# Patient Record
Sex: Female | Born: 1945 | Race: White | Hispanic: No | Marital: Single | Smoking: Never smoker
Health system: Southern US, Community
[De-identification: ages and names within clinical notes are randomized; demographics above are authoritative.]

## PROBLEM LIST (undated history)

## (undated) DIAGNOSIS — E785 Hyperlipidemia, unspecified: Secondary | ICD-10-CM

## (undated) DIAGNOSIS — I10 Essential (primary) hypertension: Secondary | ICD-10-CM

## (undated) DIAGNOSIS — K219 Gastro-esophageal reflux disease without esophagitis: Secondary | ICD-10-CM

## (undated) DIAGNOSIS — Z01419 Encounter for gynecological examination (general) (routine) without abnormal findings: Secondary | ICD-10-CM

## (undated) HISTORY — PX: FACELIFT: SHX1566

## (undated) HISTORY — DX: Gastro-esophageal reflux disease without esophagitis: K21.9

## (undated) HISTORY — PX: TONSILLECTOMY: SUR1361

## (undated) HISTORY — PX: ABDOMINAL HYSTERECTOMY: SHX81

## (undated) HISTORY — DX: Encounter for gynecological examination (general) (routine) without abnormal findings: Z01.419

## (undated) HISTORY — DX: Hyperlipidemia, unspecified: E78.5

## (undated) HISTORY — DX: Essential (primary) hypertension: I10

---

## 2004-09-12 ENCOUNTER — Ambulatory Visit: Payer: Self-pay | Admitting: Family Medicine

## 2005-01-06 ENCOUNTER — Ambulatory Visit: Payer: Self-pay | Admitting: Family Medicine

## 2005-01-07 ENCOUNTER — Encounter: Admission: RE | Admit: 2005-01-07 | Discharge: 2005-01-07 | Payer: Self-pay | Admitting: Family Medicine

## 2005-01-07 IMAGING — CR DG HIP (WITH OR WITHOUT PELVIS) 2-3V*L*
2 series · 2 of 2 positions shown · non-contrast
Comparison: none

CLINICAL DATA: Hip and buttock pain.  Low back pain.  
 TWO VIEW LEFT HIP:
 There is some degenerative change at the left hip with some subchondral sclerosis.  Joint space appears relatively well maintained.  No fracture or dislocation.

[view not recorded (1 of 2)]
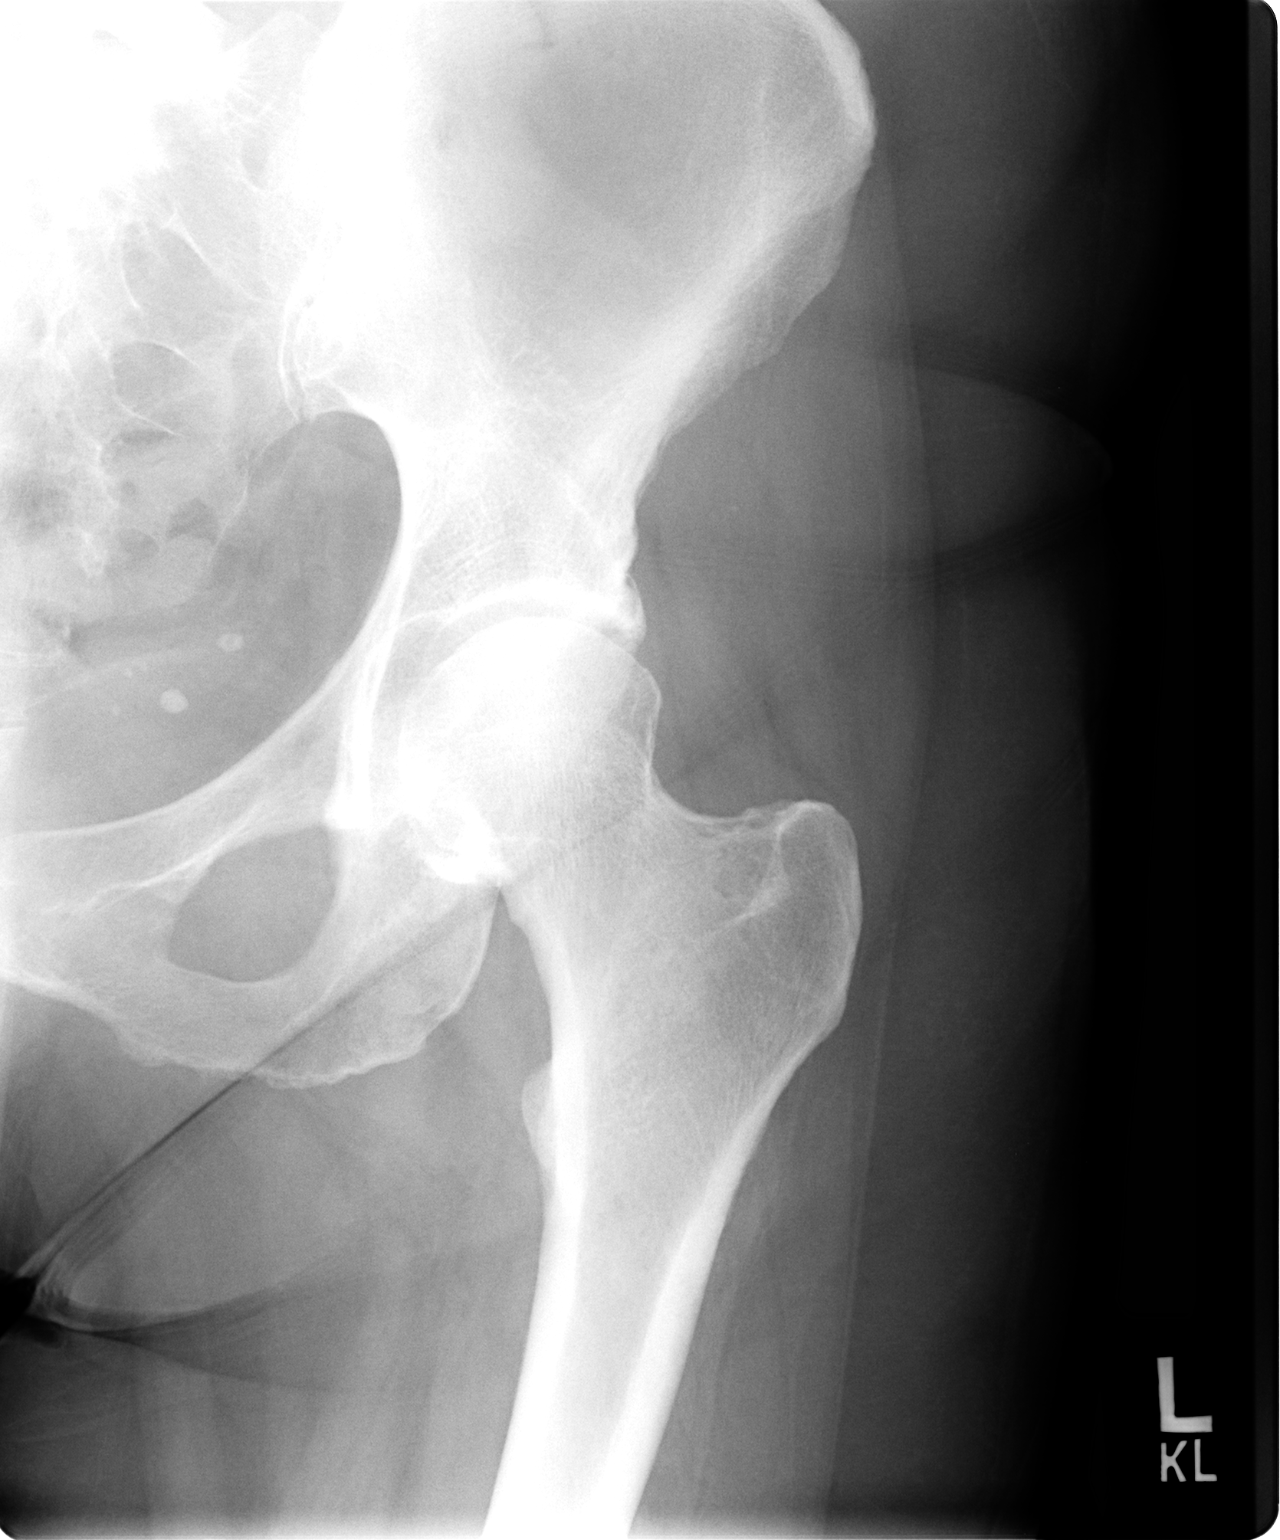

[view not recorded (2 of 2)]
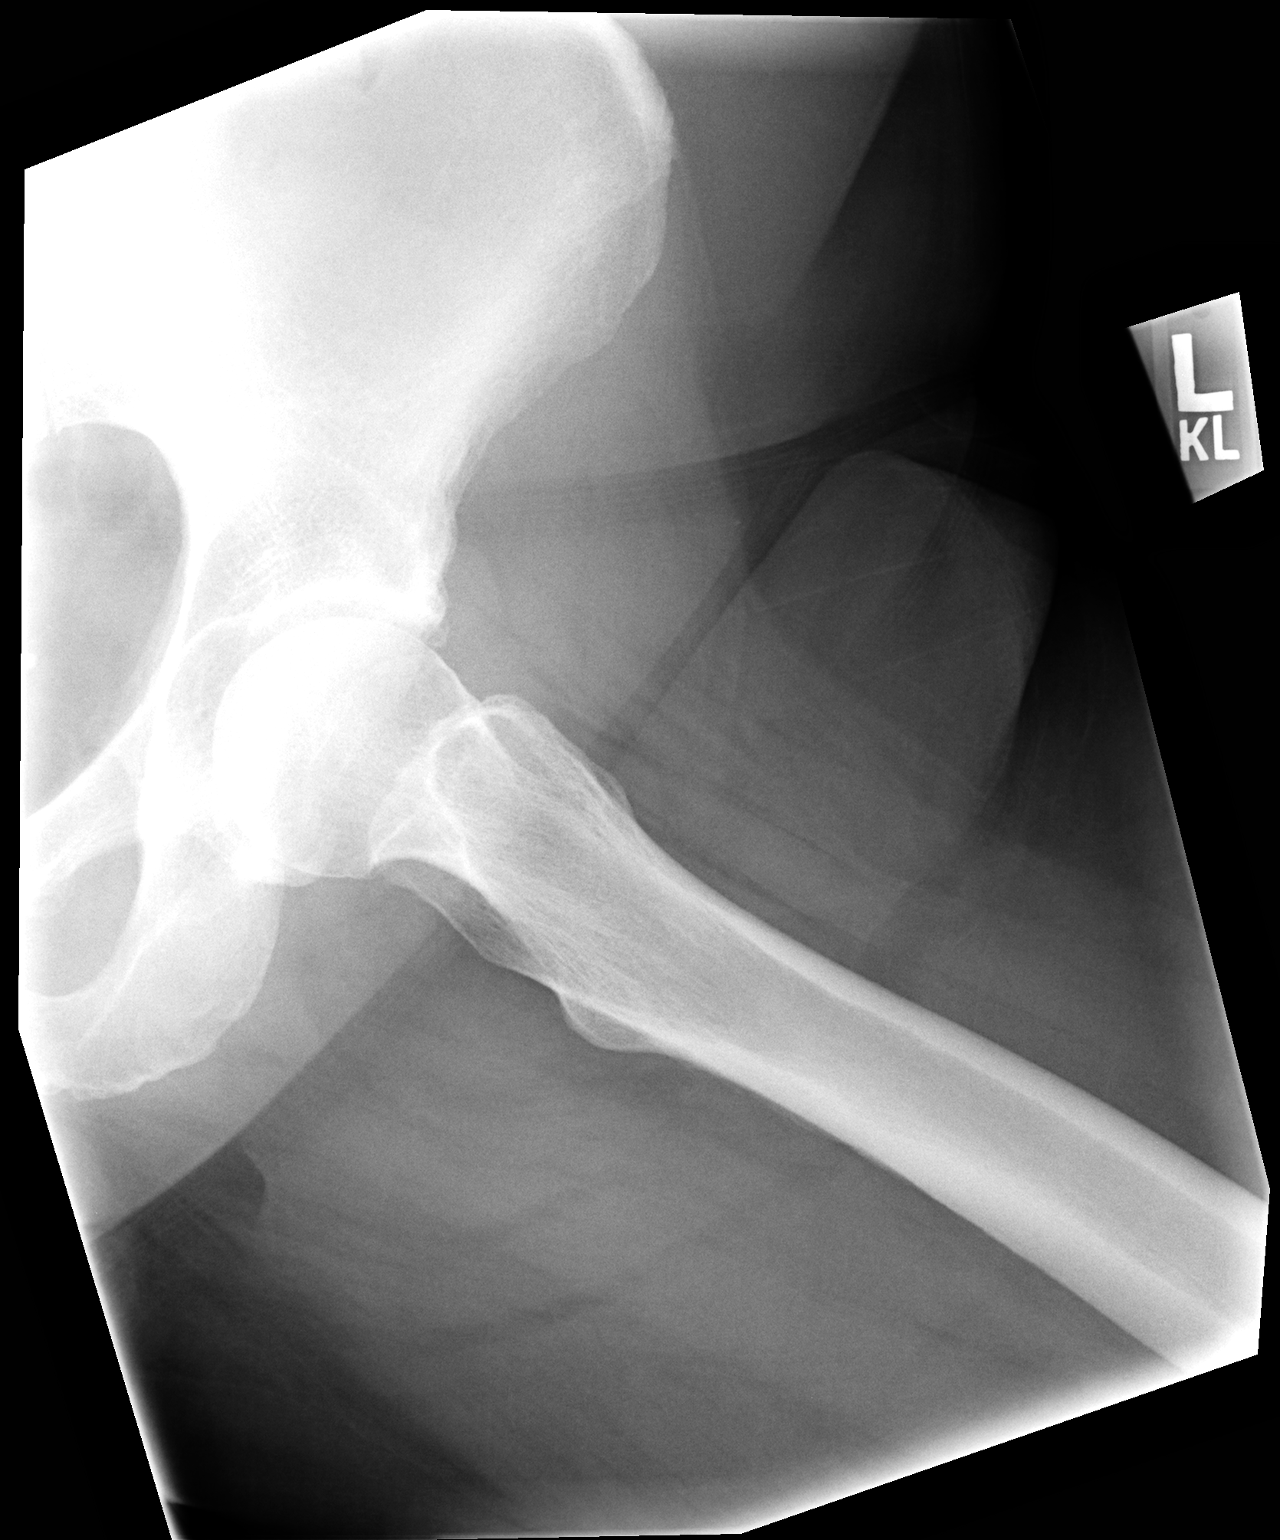

[2 of 2 positions shown; findings below may reference images not displayed]

IMPRESSION: Mild appearing left hip osteoarthritis. 
 FOUR VIEW LUMBAR SPINE:
 Vertebral body height and alignment are maintained.  There is loss of disc space height at L1-2 and L2-3 levels.  There is some anterior and posterior endplate spurring.  Facet arthropathy is noted.  Soft tissues are unremarkable.
IMPRESSION: Degenerative disease most noted at L1-2 and L2-3.  No other finding.

## 2005-02-27 ENCOUNTER — Encounter: Admission: RE | Admit: 2005-02-27 | Discharge: 2005-02-27 | Payer: Self-pay | Admitting: Family Medicine

## 2005-04-27 ENCOUNTER — Ambulatory Visit: Payer: Self-pay | Admitting: Family Medicine

## 2005-05-14 ENCOUNTER — Encounter: Admission: RE | Admit: 2005-05-14 | Discharge: 2005-05-14 | Payer: Self-pay | Admitting: Family Medicine

## 2005-05-18 ENCOUNTER — Ambulatory Visit: Payer: Self-pay | Admitting: Family Medicine

## 2005-09-15 ENCOUNTER — Ambulatory Visit: Payer: Self-pay | Admitting: Family Medicine

## 2005-12-07 ENCOUNTER — Ambulatory Visit: Payer: Self-pay | Admitting: Family Medicine

## 2006-03-17 ENCOUNTER — Encounter: Admission: RE | Admit: 2006-03-17 | Discharge: 2006-03-17 | Payer: Self-pay | Admitting: Family Medicine

## 2006-04-30 ENCOUNTER — Ambulatory Visit: Payer: Self-pay | Admitting: Family Medicine

## 2006-05-12 ENCOUNTER — Ambulatory Visit: Payer: Self-pay

## 2006-05-19 ENCOUNTER — Ambulatory Visit: Payer: Self-pay | Admitting: Family Medicine

## 2006-06-03 ENCOUNTER — Ambulatory Visit: Payer: Self-pay | Admitting: Family Medicine

## 2006-06-21 ENCOUNTER — Ambulatory Visit: Payer: Self-pay | Admitting: Cardiology

## 2006-07-19 ENCOUNTER — Ambulatory Visit: Payer: Self-pay | Admitting: Family Medicine

## 2006-07-20 LAB — CONVERTED CEMR LAB
Basophils Absolute: 0.1 10*3/uL (ref 0.0–0.1)
Eosinophils Absolute: 0.2 10*3/uL (ref 0.0–0.6)
Eosinophils Relative: 4.7 % (ref 0.0–5.0)
LH: 17.8 milliintl units/mL
MCHC: 33.8 g/dL (ref 30.0–36.0)
MCV: 95.1 fL (ref 78.0–100.0)
Platelets: 264 10*3/uL (ref 150–400)
RBC: 3.88 M/uL (ref 3.87–5.11)
RDW: 12.6 % (ref 11.5–14.6)
WBC: 5 10*3/uL (ref 4.5–10.5)

## 2006-07-30 ENCOUNTER — Ambulatory Visit: Payer: Self-pay | Admitting: Family Medicine

## 2006-07-30 LAB — CONVERTED CEMR LAB
AST: 17 units/L (ref 0–37)
Bilirubin, Direct: 0.1 mg/dL (ref 0.0–0.3)
Chloride: 106 meq/L (ref 96–112)
Cholesterol: 231 mg/dL (ref 0–200)
Creatinine, Ser: 0.7 mg/dL (ref 0.4–1.2)
Direct LDL: 124.7 mg/dL
Glucose, Bld: 98 mg/dL (ref 70–99)
HDL: 66.9 mg/dL (ref >39.0)
Sodium: 143 meq/L (ref 135–145)
Total Bilirubin: 1 mg/dL (ref 0.3–1.2)
Total Protein: 7.2 g/dL (ref 6.0–8.3)

## 2006-10-11 DIAGNOSIS — I10 Essential (primary) hypertension: Secondary | ICD-10-CM

## 2006-10-11 DIAGNOSIS — K219 Gastro-esophageal reflux disease without esophagitis: Secondary | ICD-10-CM

## 2006-11-17 ENCOUNTER — Ambulatory Visit: Payer: Self-pay | Admitting: Family Medicine

## 2006-11-26 ENCOUNTER — Encounter: Payer: Self-pay | Admitting: Family Medicine

## 2006-12-01 HISTORY — PX: COLONOSCOPY: SHX174

## 2006-12-23 ENCOUNTER — Ambulatory Visit: Payer: Self-pay | Admitting: Cardiology

## 2007-01-17 ENCOUNTER — Ambulatory Visit: Payer: Self-pay | Admitting: Family Medicine

## 2007-03-22 ENCOUNTER — Encounter: Admission: RE | Admit: 2007-03-22 | Discharge: 2007-03-22 | Payer: Self-pay | Admitting: Family Medicine

## 2007-03-23 ENCOUNTER — Ambulatory Visit: Payer: Self-pay | Admitting: Family Medicine

## 2007-05-12 ENCOUNTER — Encounter: Payer: Self-pay | Admitting: Family Medicine

## 2007-05-12 ENCOUNTER — Ambulatory Visit: Payer: Self-pay

## 2007-05-13 ENCOUNTER — Ambulatory Visit: Payer: Self-pay | Admitting: Family Medicine

## 2007-05-13 DIAGNOSIS — M722 Plantar fascial fibromatosis: Secondary | ICD-10-CM | POA: Insufficient documentation

## 2007-06-03 ENCOUNTER — Ambulatory Visit: Payer: Self-pay | Admitting: Cardiology

## 2007-06-20 ENCOUNTER — Ambulatory Visit: Payer: Self-pay | Admitting: Family Medicine

## 2007-06-20 DIAGNOSIS — D239 Other benign neoplasm of skin, unspecified: Secondary | ICD-10-CM | POA: Insufficient documentation

## 2007-06-21 ENCOUNTER — Ambulatory Visit: Payer: Self-pay | Admitting: Family Medicine

## 2007-07-06 ENCOUNTER — Ambulatory Visit: Payer: Self-pay | Admitting: Family Medicine

## 2007-11-03 ENCOUNTER — Telehealth: Payer: Self-pay | Admitting: Family Medicine

## 2008-01-05 ENCOUNTER — Encounter: Payer: Self-pay | Admitting: Family Medicine

## 2008-05-30 ENCOUNTER — Encounter: Payer: Self-pay | Admitting: Family Medicine

## 2008-12-17 ENCOUNTER — Encounter: Payer: Self-pay | Admitting: Family Medicine

## 2009-12-12 ENCOUNTER — Ambulatory Visit: Payer: Self-pay | Admitting: Family Medicine

## 2009-12-13 ENCOUNTER — Telehealth: Payer: Self-pay | Admitting: Family Medicine

## 2010-01-03 ENCOUNTER — Telehealth: Payer: Self-pay | Admitting: Family Medicine

## 2010-01-27 ENCOUNTER — Encounter: Payer: Self-pay | Admitting: Family Medicine

## 2010-01-29 ENCOUNTER — Telehealth: Payer: Self-pay | Admitting: Family Medicine

## 2010-01-30 LAB — CONVERTED CEMR LAB
Albumin: 4.3 g/dL (ref 3.5–5.2)
Alkaline Phosphatase: 95 units/L (ref 39–117)
Basophils Absolute: 0 10*3/uL (ref 0.0–0.1)
Basophils Relative: 0 % (ref 0–1)
Bilirubin Urine: NEGATIVE
Chloride: 101 meq/L (ref 96–112)
Creatinine, Ser: 0.74 mg/dL (ref 0.40–1.20)
HDL: 80 mg/dL (ref >39)
Ketones, ur: NEGATIVE mg/dL
LDL Cholesterol: 115 mg/dL — ABNORMAL HIGH (ref 0–99)
MCHC: 32 g/dL (ref 30.0–36.0)
Monocytes Absolute: 0.3 10*3/uL (ref 0.1–1.0)
Neutro Abs: 2.7 10*3/uL (ref 1.7–7.7)
Neutrophils Relative %: 54 % (ref 43–77)
Platelets: 263 10*3/uL (ref 150–400)
Potassium: 4.2 meq/L (ref 3.5–5.3)
Protein, ur: NEGATIVE mg/dL
RDW: 13.5 % (ref 11.5–15.5)
Sodium: 138 meq/L (ref 135–145)
Total Protein: 7.2 g/dL (ref 6.0–8.3)
Triglycerides: 115 mg/dL (ref <150)
Urobilinogen, UA: 0.2 (ref 0.0–1.0)

## 2010-02-05 ENCOUNTER — Telehealth: Payer: Self-pay | Admitting: Family Medicine

## 2010-02-18 ENCOUNTER — Encounter: Payer: Self-pay | Admitting: Family Medicine

## 2010-02-18 ENCOUNTER — Ambulatory Visit: Payer: Self-pay | Admitting: Family Medicine

## 2010-02-18 DIAGNOSIS — E785 Hyperlipidemia, unspecified: Secondary | ICD-10-CM

## 2010-03-18 ENCOUNTER — Other Ambulatory Visit: Payer: Self-pay | Admitting: Family Medicine

## 2010-03-18 ENCOUNTER — Ambulatory Visit
Admission: RE | Admit: 2010-03-18 | Discharge: 2010-03-18 | Payer: Self-pay | Source: Home / Self Care | Attending: Family Medicine | Admitting: Family Medicine

## 2010-03-18 DIAGNOSIS — L989 Disorder of the skin and subcutaneous tissue, unspecified: Secondary | ICD-10-CM | POA: Insufficient documentation

## 2010-03-26 ENCOUNTER — Encounter
Admission: RE | Admit: 2010-03-26 | Discharge: 2010-03-26 | Payer: Self-pay | Source: Home / Self Care | Attending: Family Medicine | Admitting: Family Medicine

## 2010-04-01 ENCOUNTER — Ambulatory Visit
Admission: RE | Admit: 2010-04-01 | Discharge: 2010-04-01 | Payer: Self-pay | Source: Home / Self Care | Attending: Family Medicine | Admitting: Family Medicine

## 2010-04-01 NOTE — Assessment & Plan Note (Signed)
Summary: flu shot/cjr  Nurse Visit   Allergies: No Known Drug Allergies  Orders Added: 1)  Admin 1st Vaccine [90471] 2)  Flu Vaccine 28yrs + [86578]  Flu Vaccine Consent Questions     Do you have a history of severe allergic reactions to this vaccine? no    Any prior history of allergic reactions to egg and/or gelatin? no    Do you have a sensitivity to the preservative Thimersol? no    Do you have a past history of Guillan-Barre Syndrome? no    Do you currently have an acute febrile illness? no    Have you ever had a severe reaction to latex? no    Vaccine information given and explained to patient? yes    Are you currently pregnant? no    Lot Number:[id number]   Exp Date:08/30/2010   Site Given  Left Deltoid IM Romualdo Bolk, CMA Duncan Dull)  December 12, 2009 2:17 PM

## 2010-04-01 NOTE — Progress Notes (Signed)
Summary: new rx needed  Phone Note Call from Patient Call back at Home Phone [phone]   Caller: Patient---live call Summary of Call: Pt's former Dr Has changed her Lisinopril 10mg  1qd, not 20mg . She is requesting a new rx sent [address] at [phone]. has cpx soon. Initial call taken by: Warnell Forester,  January 29, 2010 2:14 PM  Follow-up for Phone Call        change the Lisinopril to 10 mg once daily , Call in #30 with 2 rf  Follow-up by: Nelwyn Salisbury MD,  January 29, 2010 5:10 PM  Additional Follow-up for Phone Call Additional follow up Details #1::        done pt aware.  Additional Follow-up by: Pura Spice, RN,  January 30, 2010 8:24 AM    New/Updated Medications: LISINOPRIL 10 MG TABS (LISINOPRIL) 1 by mouth once daily Prescriptions: LISINOPRIL 10 MG TABS (LISINOPRIL) 1 by mouth once daily  #30 x 1   Entered by:   Pura Spice, RN   Authorized by:   Nelwyn Salisbury MD   Signed by:   Pura Spice, RN on 01/30/2010   Method used:   Electronically to        [facility] [address] [id number]* (retail)       [address] [place], [place]  [address]       Ph: [phone]       Fax: [phone]   RxID:   [id number]

## 2010-04-01 NOTE — Progress Notes (Signed)
Summary: REQUEST FOR ORDER (?)  Phone Note Call from Patient   Caller: Patient  [phone] Summary of Call: Pt called in to adv that she will need to obtain an order for cpx labwork to be done at [facility] Pt adv that her insurance [facility]) has advised her that they will not cover labwork if it is done in the doctors office / only covers if done at hospital?     Pt can be reached at [phone] when order is ready for p/u.  Initial call taken by: Debbra Riding,  January 03, 2010 12:05 PM  Follow-up for Phone Call        this makes no sense. Please call her and get more info.  Follow-up by: Nelwyn Salisbury MD,  January 03, 2010 2:55 PM  Additional Follow-up for Phone Call Additional follow up Details #1::        I called pts # [phone]....Marland KitchenMarland KitchenPt adv that [facility] is not licensed with her insurance [facility] Medicare PPO enhanced - [employer]) for labwork.... Therefore, she will have to go by the Hospital to have labwork done.... Pt adv that she even asked her insurance rep about having labwork at [facility] and they advised her that [employer] in general were not licensed with them???????????  Additional Follow-up by: Debbra Riding,  January 06, 2010 8:53 AM    Additional Follow-up for Phone Call Additional follow up Details #2::    what about drawing the labs here but sending them to [facility] to run?  Follow-up by: Nelwyn Salisbury MD,  January 06, 2010 1:26 PM  Additional Follow-up for Phone Call Additional follow up Details #3:: Details for Additional Follow-up Action Taken: I spoke with pt and she said that she spoke with a supervisor at her ins company ([employer] [place] [employer] [employer]) and they adv that her insurance will only pay for labwork to be done at 4 places in [place].......Marland Kitchen 1) [facility], 2) [facility], 3) [facility],  and  4) [facility]  [place]. Orders were written and can be picked up to take to [facility].  pt notified.    Additional Follow-up by: Debbra Riding,  January 06, 2010 3:37 PM

## 2010-04-01 NOTE — Progress Notes (Signed)
Summary: Pt has question re: getting H1N1 vaccine  Phone Note Call from Patient Call back at Home Phone [phone]   Caller: Patient Summary of Call: Pt wants to now how often do you have to get the H1N1 vaccine? Pls advise.  Initial call taken by: Lucy Antigua,  December 13, 2009 2:57 PM  Follow-up for Phone Call        once a year Follow-up by: Nelwyn Salisbury MD,  December 13, 2009 4:14 PM  Additional Follow-up for Phone Call Additional follow up Details #1::        called  Additional Follow-up by: Pura Spice, RN,  December 13, 2009 5:07 PM

## 2010-04-03 NOTE — Miscellaneous (Signed)
Summary: Consent for Mole Removal  Consent for Mole Removal   Imported By: Maryln Gottron 03/21/2010 09:45:57  _____________________________________________________________________  External Attachment:    Type:   Image     Comment:   External Document

## 2010-04-03 NOTE — Assessment & Plan Note (Signed)
Summary: CPX/CJR   Vital Signs:  Patient profile:   65 year old female Weight:      163 pounds BMI:     26.81 O2 Sat:      98 % Temp:     97.6 degrees F Pulse rate:   87 / minute BP sitting:   120 / 80  (left arm)  Vitals Entered By: Pura Spice, RN (February 18, 2010 1:44 PM) CC: cpx  no pap    History of Present Illness: 65 yr old female for a cpx. She is doing well and has no complaints. She and her partner moved back here from [place] earlier this year.   Allergies (verified): No Known Drug Allergies  Past History:  Past Medical History: Hypertension GERD Hyperlipidemia sees Dr. Tonny Bollman in [place] once a year for GYN exams  Past Surgical History: Hysterectomy Tonsillectomy facelift colonoscopy Oct. 2008, diverticulae only, repeat 10 yrs  Past History:  Care Management: Gynecology:  Dr Noel Gerold  Family History: Reviewed history from 11/17/2006 and no changes required. Family History of Arthritis Family History of Cardiovascular disorder  Social History: Reviewed history from 11/17/2006 and no changes required. Occupation: medical receptionist Single Never Smoked Alcohol use-yes  Review of Systems  The patient denies anorexia, fever, weight loss, weight gain, vision loss, decreased hearing, hoarseness, chest pain, syncope, dyspnea on exertion, peripheral edema, prolonged cough, headaches, hemoptysis, abdominal pain, melena, hematochezia, severe indigestion/heartburn, hematuria, incontinence, genital sores, muscle weakness, suspicious skin lesions, transient blindness, difficulty walking, depression, unusual weight change, abnormal bleeding, enlarged lymph nodes, angioedema, breast masses, and testicular masses.    Physical Exam  General:  Well-developed,well-nourished,in no acute distress; alert,appropriate and cooperative throughout examination Head:  Normocephalic and atraumatic without obvious abnormalities. No apparent alopecia or  balding. Eyes:  No corneal or conjunctival inflammation noted. EOMI. Perrla. Funduscopic exam benign, without hemorrhages, exudates or papilledema. Vision grossly normal. Ears:  External ear exam shows no significant lesions or deformities.  Otoscopic examination reveals clear canals, tympanic membranes are intact bilaterally without bulging, retraction, inflammation or discharge. Hearing is grossly normal bilaterally. Nose:  External nasal examination shows no deformity or inflammation. Nasal mucosa are pink and moist without lesions or exudates. Mouth:  Oral mucosa and oropharynx without lesions or exudates.  Teeth in good repair. Neck:  No deformities, masses, or tenderness noted. Chest Wall:  No deformities, masses, or tenderness noted. Lungs:  Normal respiratory effort, chest expands symmetrically. Lungs are clear to auscultation, no crackles or wheezes. Heart:  Normal rate and regular rhythm. S1 and S2 normal without gallop, murmur, click, rub or other extra sounds. EKG normal Abdomen:  Bowel sounds positive,abdomen soft and non-tender without masses, organomegaly or hernias noted. Msk:  No deformity or scoliosis noted of thoracic or lumbar spine.   Pulses:  R and L carotid,radial,femoral,dorsalis pedis and posterior tibial pulses are full and equal bilaterally Extremities:  No clubbing, cyanosis, edema, or deformity noted with normal full range of motion of all joints.   Neurologic:  No cranial nerve deficits noted. Station and gait are normal. Plantar reflexes are down-going bilaterally. DTRs are symmetrical throughout. Sensory, motor and coordinative functions appear intact. Skin:  Intact without suspicious lesions or rashes Cervical Nodes:  No lymphadenopathy noted Axillary Nodes:  No palpable lymphadenopathy Inguinal Nodes:  No significant adenopathy Psych:  Cognition and judgment appear intact. Alert and cooperative with normal attention span and concentration. No apparent delusions,  illusions, hallucinations   Impression & Recommendations:  Problem # 1:  HYPERLIPIDEMIA (ICD-272.4)  Problem # 2:  GERD (ICD-530.81)  Problem # 3:  HYPERTENSION (ICD-401.9)  Her updated medication list for this problem includes:    Lisinopril 10 Mg Tabs (Lisinopril) .Marland Kitchen... 1 by mouth once daily  Orders: EKG w/ Interpretation (93000)  Complete Medication List: 1)  Estradiol 2 Mg Tabs (Estradiol) .... Take 1 tablet by mouth once a day 2)  Aspirin 81 Mg Tbec (Aspirin) .... Once daily 3)  Calcium 600-200 Mg-unit Tabs (Calcium-vitamin d) .... Two times a day 4)  Lisinopril 10 Mg Tabs (Lisinopril) .Marland Kitchen.. 1 by mouth once daily 5)  Sm Milk of Magnesia 400 Mg/58ml Susp (Magnesium hydroxide) .Marland Kitchen.. 1 tablespoon at hs  Patient Instructions: 1)  Please schedule a follow-up appointment in 6 months .  Prescriptions: LISINOPRIL 10 MG TABS (LISINOPRIL) 1 by mouth once daily  #30 x 11   Entered and Authorized by:   Nelwyn Salisbury MD   Signed by:   Nelwyn Salisbury MD on 02/18/2010   Method used:   Electronically to        [facility] [address] [id number]* (retail)       [address]       [place], [place]  [address]       Ph: [phone]       Fax: [phone]   RxID:   [id number]    Orders Added: 1)  Est. Patient Level IV [33295] 2)  EKG w/ Interpretation [93000]

## 2010-04-03 NOTE — Progress Notes (Signed)
Summary: labs not covered.  Phone Note From Other Clinic   Caller: [facility] [phone] ext [phone] Summary of Call: christ[facility] from [facility] left vm regarding above pt stated she came in on 01-27-2010 and had drawn cbcd lipid and tsh and they need to use another code other than v70. pls call back  as medicare does not cover. Initial call taken by: Pura Spice, RN,  February 05, 2010 3:47 PM  Follow-up for Phone Call        I am not sure what dx code this should have been.  Dr. Clent Ridges please advised what the diagnosis should be for the labs that were ordered. Follow-up by: Trixie Dredge,  February 05, 2010 4:23 PM  Additional Follow-up for Phone Call Additional follow up Details #1::        change this to 401.9 please  Additional Follow-up by: Nelwyn Salisbury MD,  February 10, 2010 1:26 PM    Additional Follow-up for Phone Call Additional follow up Details #2::    [facility] for [facility] to correct dx code submitted. Follow-up by: Trixie Dredge,  February 12, 2010 2:56 PM

## 2010-04-03 NOTE — Assessment & Plan Note (Signed)
Summary: mole removal///ccm   Vital Signs:  Patient profile:   65 year old female Pulse rate:   112 / minute BP sitting:   150 / 86  (left arm) Cuff size:   regular  Vitals Entered By: Pura Spice, RN (March 18, 2010 1:55 PM) CC: mole removal   History of Present Illness: Here to remove 2 lesions on her back that we evaluated at our last visit.   Allergies: No Known Drug Allergies  Physical Exam  General:  Well-developed,well-nourished,in no acute distress; alert,appropriate and cooperative throughout examination Skin:  the upper lesion appears to be a wart,is  located on the left upper shoulder, and measures 1.3  cm across.  The lower lesion is in the center of the lower back, appears to be an achrocordon, and measures 0.6 cm across.   Impression & Recommendations:  Problem # 1:  SKIN LESION (ICD-709.9)  Orders: Excise lesion (TAL) 0.6 - 1.0 (11401) Excise lesion (TAL) 1.1 - 2.0 cm (11402)  Complete Medication List: 1)  Estradiol 2 Mg Tabs (Estradiol) .... Take 1 tablet by mouth once a day 2)  Aspirin 81 Mg Tbec (Aspirin) .... Once daily 3)  Calcium 600-200 Mg-unit Tabs (Calcium-vitamin d) .... Two times a day 4)  Lisinopril 10 Mg Tabs (Lisinopril) .Marland Kitchen.. 1 by mouth once daily 5)  Sm Milk of Magnesia 400 Mg/32ml Susp (Magnesium hydroxide) .Marland Kitchen.. 1 tablespoon at hs  Patient Instructions: 1)  after informed consent was obtained, both areas were cleansed with Betadine. LA was achieved at both sites using 1% Xylocaine with epinephrine. An elliptical incision was made around the upper lesion with a scalpel down to the subcutaneous fat layer. The lesion was removed and sent to Pathology. The wound was closed using 3 sutures of 4-0 Ethilon. Dressed with Neosporin and gauze. The lower lesion was shaved off using a scalpel, and it was sent to Pathology. The wound was cauterized with a hyfrecator, and dressed. Sutures are to be removed in 14 days.    Orders Added: 1)  Excise  lesion (TAL) 0.6 - 1.0 [11401] 2)  Excise lesion (TAL) 1.1 - 2.0 cm [11402]   Immunization History:  Pneumovax Immunization History:    Pneumovax:  historical (03/03/2007)   Immunization History:  Pneumovax Immunization History:    Pneumovax:  Historical (03/03/2007)

## 2010-04-09 NOTE — Assessment & Plan Note (Signed)
Summary: ROA/FUP/RCD   Vital Signs:  Patient profile:   65 year old female Weight:      164 pounds O2 Sat:      95 % Temp:     98.4 degrees F Pulse rate:   84 / minute BP sitting:   120 / 80  (left arm) Cuff size:   regular  Vitals Entered By: Pura Spice, RN (April 01, 2010 10:49 AM) CC: remove sutures left upper shoulder  area well approximated  no sign of infection   History of Present Illness: Here to remove sutures after we removed some benign lesions 2 weeks ago. The upper surgical site has 3 sutures present. The area itches a bit bit otherwise she is doing well.   Allergies: No Known Drug Allergies  Past History:  Past Medical History: Reviewed history from 02/18/2010 and no changes required. Hypertension GERD Hyperlipidemia sees Dr. Tonny Bollman in [place] once a year for GYN exams  Review of Systems  The patient denies anorexia, fever, weight loss, weight gain, vision loss, decreased hearing, hoarseness, chest pain, syncope, dyspnea on exertion, peripheral edema, prolonged cough, headaches, hemoptysis, abdominal pain, melena, hematochezia, severe indigestion/heartburn, hematuria, incontinence, genital sores, muscle weakness, suspicious skin lesions, transient blindness, difficulty walking, depression, unusual weight change, abnormal bleeding, enlarged lymph nodes, angioedema, breast masses, and testicular masses.    Physical Exam  General:  Well-developed,well-nourished,in no acute distress; alert,appropriate and cooperative throughout examination Skin:  both sites look good and are healing well.    Impression & Recommendations:  Problem # 1:  SKIN LESION (ICD-709.9)  Orders: No Charge Patient Arrived (NCPA0) (NCPA0)  Complete Medication List: 1)  Estradiol 2 Mg Tabs (Estradiol) .... Take 1 tablet by mouth once a day 2)  Aspirin 81 Mg Tbec (Aspirin) .... Once daily 3)  Calcium 600-200 Mg-unit Tabs (Calcium-vitamin d) .... Two times a day 4)  Lisinopril  10 Mg Tabs (Lisinopril) .Marland Kitchen.. 1 by mouth once daily 5)  Sm Milk of Magnesia 400 Mg/33ml Susp (Magnesium hydroxide) .Marland Kitchen.. 1 tablespoon at hs  Patient Instructions: 1)  all 3 sutures were removed. Follow up as scheduled   Orders Added: 1)  No Charge Patient Arrived (NCPA0) [NCPA0]

## 2010-04-14 ENCOUNTER — Telehealth: Payer: Self-pay | Admitting: Family Medicine

## 2010-04-14 NOTE — Telephone Encounter (Signed)
Pt called and is req to get a copy of mammogram.   Pls call when this is ready for pick up.

## 2010-04-14 NOTE — Telephone Encounter (Signed)
Pt notified and copy of report at front desk.

## 2010-07-15 NOTE — Assessment & Plan Note (Signed)
[facility]                            CARDIOLOGY OFFICE NOTE   NAME:HARRISDearia, Wilmouth                         MRN:          [id number]  DATE:12/23/2006                            DOB:          1945/10/25    Ms. Zurn is a very pleasant 65 year old female who has a history of  hypertension and an abnormal nuclear study.  Her nuclear study in March  of 2008 showed a question of mild apical ischemia.  We have elected to  follow this.  Since I last saw her she is doing extremely well.  There  is no dyspnea, chest pain, palpitations or syncope.  She has had no  syncopal episodes.   MEDICATIONS:  Include lisinopril 20 mg p.o. daily, Estradiol 2 mg p.o.  daily, aspirin 81 mg p.o. daily, calcium and D.   PHYSICAL EXAM TODAY:  Shows a blood pressure of 120/78 and her pulse is  65. She weighs 152 pounds.  HEENT:  Normal.  NECK:  Supple with no bruits.  CHEST:  Clear.  CARDIOVASCULAR EXAM:  Regular rate and rhythm.  ABDOMEN:  Exam shows no tenderness.  EXTREMITIES:  Show no edema.   Her electrocardiogram shows a sinus rhythm at a rate of 65.  The axis is  normal.  There are no ST changes noted.   DIAGNOSES:  1. Recent abnormal nuclear study.  I have discussed again the results      of her previous nuclear study.  There may be some shifting breast      attenuation that contributed to her small defect.  Regardless, it      was a low-risk study and we have elected to not pursue cardiac      catheterization given she is having no symptoms.  She would like to      continue with this course.  We will plan to repeat her Myoview in 6      months and then I will see her back.  If it is unchanged from      normal then we again will not pursue further cardiac workup.  2. Hypertension - her blood pressure is well controlled on her present      medications.   We will see her back in 6 months.     Madolyn Frieze Jens Som, MD, [facility]  Electronically Signed    BSC/MedQ   DD: 12/23/2006  DT: 12/23/2006  Job #: [id number]   cc:   Jeannett Senior A. Clent Ridges, MD

## 2010-07-15 NOTE — Assessment & Plan Note (Signed)
[facility]                            CARDIOLOGY OFFICE NOTE   NATALINA, WIETING                         MRN:          [id number]  DATE:06/03/2007                            DOB:          29-Oct-1945    Ms. Omura is a pleasant, 65 year old with a history of hypertension and  mild abnormality on a previous nuclear study.  Her nuclear study in  March 2008, showed a question of mild apical ischemia.  However, her  symptoms had improved and we have elected to follow this.  She had a  repeat Myoview on May 12, 2007.  At that time, her perfusion was  normal and ejection fraction was 80%.  Since I last saw her, she has not  had chest pain, shortness of breath, edema or syncope.   MEDICATIONS:  1. Lisinopril 20 mg daily.  2. Estradiol 2 mg daily.  3. Aspirin 81 mg daily.  4. Calcium as well as vitamin D.   PHYSICAL EXAMINATION:  VITAL SIGNS:  Blood pressure 119/72 and a pulse  of 69.  Weight 154 pounds.  HEENT:  Normal.  NECK:  Supple.  No bruits.  CHEST:  Clear.  CARDIOVASCULAR:  Regular rate and rhythm.  ABDOMEN:  No tenderness.  EXTREMITIES:  No edema.   Electrocardiogram shows sinus rhythm at rate of 75.  No ST changes  noted.   DIAGNOSES:  1. History of mildly abnormal nuclear study.  Her followup nuclear      study shows no abnormalities and she is not having chest pain.      Will not pursue further cardiac workup.  2. Hypertension.  Blood pressure controlled on present medications.   Ms. Tieken is planning to move to [place].  She will follow up with a  primary care physician there.  We will see her back on as-needed basis.     Madolyn Frieze Jens Som, MD, [facility]  Electronically Signed    BSC/MedQ  DD: 06/03/2007  DT: 06/03/2007  Job #: [id number]

## 2010-07-18 NOTE — Assessment & Plan Note (Signed)
[facility] OFFICE NOTE   NAME:[name], [name]                         MRN:          [id number]  DATE:09/15/2005                            DOB:          [birth year]    HISTORY OF PRESENT ILLNESS:  This is a 65 year old woman here for a non-  gynecological physical examination. On physical examination, she feels fine  and has no complaints. She continues to go up to [place] once a year to  have her gynecologist give her an examination. Everything has been coming  out fine. She continues to hormone replacement therapy. Her blood pressures  have been well managed at home. She continues to see her optometrist for  twice a year visits for mildly elevated eye pressures. Thus far, she has not  needed therapy for this. For details of her past medical history, family  history, social history, etc., See note dated September 12, 2004. She was here  for fasting laboratories on May 18, 2005. This was remarkable only for her  lipid panel. LDL was mildly elevated at 147 but her HDL was excellent was  68. She does try to watch what she eats.   ALLERGIES:  NO KNOWN DRUG ALLERGIES.   CURRENT MEDICATIONS:  Estradiol 20 mg daily, aspirin 325 mg 1 every other  day, calcium with vitamin D supplements daily, and Lisinopril 20 mg once  daily.   OBJECTIVE:  VITAL SIGNS:  Height 5 foot 6. Weight 152. Blood pressure  124/80. Pulse 70 and regular.  GENERAL:  She appears healthy.  SKIN:  Free of significant lesions.  HEENT:  Eyes clear. Sclera clear. Pharynx clear.  NECK:  Supple without lymphadenopathy, masses.  LUNGS:  Clear.  CARDIAC:  Regular rate and rhythm without murmur, rub, or gallop. Distal  pulses are full. EKG is within normal limits.  ABDOMEN:  Soft. Normal bowel sounds. Nontender and no masses.  RECTAL:  No masses or tenderness. Stool Hemoccult negative.  EXTREMITIES:  No clubbing, cyanosis, or edema.  NEUROLOGIC:   Examination is grossly intact.   ASSESSMENT:  1.  COMPLETE PHYSICAL EXAMINATION:  She has never had a colonoscopy and I      had yet another conversation with her about the importance of doing it.      She is still reluctant to do so, however, and said that she would think      about it.  2.  HORMONE REPLACEMENT THERAPY:  Per her gynecologist.  3.  MILD HYPERLIPIDEMIA:  She will continue to watch her diet.  4.  HYPERTENSION:  Stable.                                   Tera Mater. Clent Ridges, MD   SAF/MedQ  DD:  09/15/2005  DT:  09/15/2005  Job #:  [id number]

## 2010-07-18 NOTE — Assessment & Plan Note (Signed)
[facility]                            CARDIOLOGY OFFICE NOTE   ESMA, KILTS                         MRN:          [id number]  DATE:06/21/2006                            DOB:          1945/04/05    Ms. Chestang is a very pleasant 65 year old female with past medical  history of hypertension who we were asked to evaluate for an abnormal  nuclear study.  She has no prior cardiac history.  Note, she walks up to  an hour a day almost 4 times a week.  She typically does not have  dyspnea on exertion, orthopnea, PND, pedal edema, palpitations,  presyncope, syncope, or exertional chest pain.  Back in early March she  was at a dinner and developed a queasy feeling in her epigastric area  associated with mild palpitations, mild dizziness, and mild nausea.  She  did not have chest pain.  Because of her symptoms she had a Myoview  ordered which was performed on June 12, 2006.  She had an ejection  fraction of 85%.  There was mild apical ischemia noted.  Because of her  abnormal nuclear study we were asked to further evaluate.  SHE HAS NO  KNOWN DRUG ALLERGIES.   MEDICATIONS:  1. Lisinopril 20 mg p.o. daily.  2. Aspirin 81 mg p.o. daily.  3. Estradiol 2 mg p.o. daily.  4. Calcium and vitamin D.   SOCIAL HISTORY:  She smoked briefly when she was in college, but has  otherwise not smoked.  She consumes 1-2 alcoholic beverages average per  day.   FAMILY HISTORY:  Negative for coronary artery disease (her mother does  have a history of atrial fibrillation and her father did have a  myocardial infarction but he was in his late 3s).   PAST MEDICAL HISTORY:  There is no diabetes mellitus or hyperlipidemia,  but there is hypertension.  She has had a prior hysterectomy.  There is  no other medical conditions noted other than brief problems with reflux  in the past.   REVIEW OF SYSTEMS:  There is no headaches, or fevers, or chills.  There  is no productive  cough or hemoptysis.  There is no dysphagia,  odynophagia, melena, or hematochezia.  There is no dysuria or hematuria.  There is no rash or seizure activity.  There is no orthopnea, PND, or  pedal edema.  There is no claudication.  The remaining systems are  negative.   PHYSICAL EXAMINATION:  Shows a blood pressure of 138/86 and her pulse is  75.  She weighs 150 pounds.  She is well-developed and well-nourished,  in no acute distress.  SKIN:  Warm and dry.  She does not appear to be depressed.  There is no peripheral clubbing.  BACK:  Normal.  HEENT:  Normal with normal eyelids.  NECK:  Supple with a normal upstroke bilaterally and I could not  appreciate bruits.  There is no jugular venous distention and no  thyromegaly is noted.  CHEST:  Clear to auscultational expansion.  CARDIOVASCULAR:  Regular rate and rhythm, normal S1 and  S2.  There are  no murmurs, rubs, or gallops noted.  Her PMI is nondisplaced.  ABDOMINAL:  Not tender or distended, positive bowel sounds, no  hepatosplenomegaly, no mass appreciated.  There is no abdominal bruit.  She has 2+ femoral pulses bilaterally and no bruits.  EXTREMITIES:  Show no edema I can palpate, no cords.  She has 2+  dorsalis pedis pulses bilaterally.  NEUROLOGIC:  Grossly intact.   Her electrocardiogram shows a sinus rhythm at a rate of 77, there are no  ST changes noted.   DIAGNOSES:  1. Abnormal nuclear study - I have reviewed her images with her today      and they do reveal a very mild apical ischemia.  I have discussed      at length the implications of this.  I do not think that it is a      high risk scan.  I question whether there may be some breast      artifact that may be contributing.  We have discussed the options      of continued medical therapy with followup Myoview versus cardiac      catheterization.  She would prefer the former and I think this is      certainly acceptable.  If she has worsening symptoms in the  future      then we could rethink this and proceed with catheterization at that      point.  I will see her back in 6 months to review her symptoms and      if she remains asymptomatic then we will plan to repeat her Myoview      in one year.  2. Hypertension - her blood pressure is well controlled on her present      medications.  3. Risk factor modification - she will continue with diet and      exercise.  Note, she does not smoke.   We will see her back in 6 months.     Madolyn Frieze Jens Som, MD, [facility]  Electronically Signed    BSC/MedQ  DD: 06/21/2006  DT: 06/21/2006  Job #: [id number]   cc:   Jeannett Senior A. Clent Ridges, MD

## 2010-08-19 ENCOUNTER — Encounter: Payer: Self-pay | Admitting: Family Medicine

## 2010-08-19 ENCOUNTER — Ambulatory Visit (INDEPENDENT_AMBULATORY_CARE_PROVIDER_SITE_OTHER): Payer: BC Managed Care – PPO | Admitting: Family Medicine

## 2010-08-19 VITALS — BP 130/94 | HR 88 | Temp 98.5°F | Ht 66.0 in | Wt 167.0 lb

## 2010-08-19 DIAGNOSIS — I10 Essential (primary) hypertension: Secondary | ICD-10-CM

## 2010-08-19 DIAGNOSIS — R490 Dysphonia: Secondary | ICD-10-CM

## 2010-08-19 MED ORDER — METOPROLOL SUCCINATE ER 25 MG PO TB24: 25.0000 mg | ORAL_TABLET | Freq: Every day | ORAL | Status: DC

## 2010-08-19 NOTE — Progress Notes (Signed)
  Subjective:    Patient ID: [name], female    DOB: 1946-03-01, 65 y.o.   MRN: [id number]  HPI Here for recent elevated BPs and for hoarseness. Several years ago while living in [place], she experienced recurrent hoarseness which was not due to upper respiratory causes. 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 Now this has come back for 2 weeks, and she asks to see an ENT here. No URI symptoms. No GERD symptoms. Her BP ayt home has been in the 140s over 90s for several weeks.    Review of Systems  Constitutional: Negative.   HENT: Negative.   Respiratory: Negative.   Cardiovascular: Negative.        Objective:   Physical Exam  Constitutional: She appears well-developed and well-nourished.  HENT:  Mouth/Throat: Oropharynx is clear and moist. No oropharyngeal exudate.  Neck: No thyromegaly present.  Cardiovascular: Normal rate, regular rhythm, normal heart sounds and intact distal pulses.   Pulmonary/Chest: Effort normal and breath sounds normal. No respiratory distress. She has no wheezes. She has no rales. She exhibits no tenderness.       Voice is hoarse   Lymphadenopathy:    She has no cervical adenopathy.          Assessment & Plan:  Her hoarseness could be a side effect of her ACE inhibitor, so we will stop the Lisinopril and switch to Metoprolol. Recheck in 2 weeks. Refer to ENT

## 2010-09-02 ENCOUNTER — Encounter: Payer: Self-pay | Admitting: Family Medicine

## 2010-09-02 ENCOUNTER — Ambulatory Visit (INDEPENDENT_AMBULATORY_CARE_PROVIDER_SITE_OTHER): Payer: BC Managed Care – PPO | Admitting: Family Medicine

## 2010-09-02 VITALS — BP 126/88 | HR 71 | Temp 98.2°F | Wt 166.0 lb

## 2010-09-02 DIAGNOSIS — I10 Essential (primary) hypertension: Secondary | ICD-10-CM

## 2010-09-02 DIAGNOSIS — R490 Dysphonia: Secondary | ICD-10-CM

## 2010-09-02 MED ORDER — METOPROLOL SUCCINATE ER 25 MG PO TB24: 25.0000 mg | ORAL_TABLET | Freq: Two times a day (BID) | ORAL | Status: DC

## 2010-09-02 MED ORDER — METOPROLOL SUCCINATE ER 25 MG PO TB24: 25.0000 mg | ORAL_TABLET | Freq: Every day | ORAL | Status: DC

## 2010-09-02 NOTE — Progress Notes (Signed)
  Subjective:    Patient ID: [name], female    DOB: May 22, 1945, 65 y.o.   MRN: [id number]  HPI Here to follow up on BP and hoarseness. She has switched from Lisinopril to Metoprolol, and her BP is still a little high. She swings from 120 to 170 systolic during the day. She feels fine except for the hoarseness. She is set to see Dr. Jenne Pane on 09-17-10 for an ENT visit.    Review of Systems  Constitutional: Negative.   Respiratory: Negative.   Cardiovascular: Negative.        Objective:   Physical Exam  Constitutional: She appears well-developed and well-nourished.  Cardiovascular: Normal rate, regular rhythm, normal heart sounds and intact distal pulses.   Pulmonary/Chest: Effort normal and breath sounds normal.       Voice is hoarse          Assessment & Plan:  Increase Metoprolol to bid to have a more consistent coverage. See Dr. Jenne Pane for a probable laryngoscopy

## 2010-09-22 ENCOUNTER — Other Ambulatory Visit: Payer: Self-pay | Admitting: Otolaryngology

## 2010-09-22 DIAGNOSIS — J3800 Paralysis of vocal cords and larynx, unspecified: Secondary | ICD-10-CM

## 2010-09-25 ENCOUNTER — Other Ambulatory Visit: Payer: Self-pay | Admitting: Otolaryngology

## 2010-09-25 ENCOUNTER — Other Ambulatory Visit: Payer: Self-pay | Admitting: Anesthesiology

## 2010-09-25 ENCOUNTER — Ambulatory Visit
Admission: RE | Admit: 2010-09-25 | Discharge: 2010-09-25 | Disposition: A | Discharge status: Home / Self Care | Payer: Medicare Other | Source: Ambulatory Visit | Attending: Otolaryngology | Admitting: Otolaryngology

## 2010-09-25 DIAGNOSIS — J3800 Paralysis of vocal cords and larynx, unspecified: Secondary | ICD-10-CM

## 2010-09-25 MED ORDER — IOHEXOL 300 MG/ML  SOLN
75.0000 mL | Freq: Once | INTRAMUSCULAR | Status: AC | PRN
  Administered 2010-09-25: 75 mL via INTRAVENOUS

## 2010-10-10 ENCOUNTER — Telehealth: Payer: Self-pay | Admitting: Family Medicine

## 2010-10-10 MED ORDER — OMEPRAZOLE 40 MG PO CPDR: 40.0000 mg | DELAYED_RELEASE_CAPSULE | Freq: Every day | ORAL | Status: DC

## 2010-10-10 MED ORDER — OMEPRAZOLE 20 MG PO CPDR: 40.0000 mg | DELAYED_RELEASE_CAPSULE | Freq: Every day | ORAL | Status: DC

## 2010-10-10 NOTE — Telephone Encounter (Signed)
Pt wants any type of reflux medication that comes in a generic form.

## 2010-10-10 NOTE — Telephone Encounter (Signed)
Script sent e-scribe and pt aware. 

## 2010-10-10 NOTE — Telephone Encounter (Signed)
Left message for pt with below info. I can call in the Nexium at pt request.

## 2010-10-10 NOTE — Telephone Encounter (Signed)
Yes I saw the ENT note and the results of her CT scan. Her BP perfect so let's keep the Metoprolol where it is. There is no generic Nexium, but we can refill this.

## 2010-10-10 NOTE — Telephone Encounter (Signed)
Pt wants to make sure that Dr. Clent Ridges did see the results from the ENT, also requesting a generic script for Nexium, and does she need to continue with current dosage for Metoprolol? Her most recent readings are 126/82 in the AM's and 118/78 in the PM's. Please advise?

## 2010-10-10 NOTE — Telephone Encounter (Signed)
Change from Nexium to Omeprazole 40 mg a day. Call in one year supply

## 2010-10-28 ENCOUNTER — Encounter: Payer: Self-pay | Admitting: Family Medicine

## 2010-10-28 ENCOUNTER — Ambulatory Visit (INDEPENDENT_AMBULATORY_CARE_PROVIDER_SITE_OTHER): Payer: Medicare Other | Admitting: Family Medicine

## 2010-10-28 VITALS — BP 110/80 | HR 85 | Temp 98.5°F | Wt 168.0 lb

## 2010-10-28 DIAGNOSIS — I10 Essential (primary) hypertension: Secondary | ICD-10-CM

## 2010-10-28 DIAGNOSIS — K219 Gastro-esophageal reflux disease without esophagitis: Secondary | ICD-10-CM

## 2010-10-28 MED ORDER — LISINOPRIL-HYDROCHLOROTHIAZIDE 10-12.5 MG PO TABS: 1.0000 | ORAL_TABLET | Freq: Every day | ORAL | Status: DC

## 2010-10-28 NOTE — Progress Notes (Signed)
  Subjective:    Patient ID: [name], female    DOB: 08-25-1945, 65 y.o.   MRN: [id number]  HPI Here to recheck HTN. We increased her Metoprolol to bid last time, and this has worked well to control the BP. However her GERD is much worse, and she thinks this is due to the Metoprolol. She saw Dr. Jearld Fenton on 09-17-10, who did a laryngoscopy and a CT scan. 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 She is taking Omeprazole daily.    Review of Systems  Constitutional: Negative.   Respiratory: Negative.   Cardiovascular: Negative.        Objective:   Physical Exam  Constitutional: She appears well-developed and well-nourished.  Cardiovascular: Normal rate, regular rhythm, normal heart sounds and intact distal pulses.   Pulmonary/Chest: Effort normal.  Abdominal: Soft. Bowel sounds are normal. She exhibits no distension and no mass. There is no tenderness. There is no rebound and no guarding.          Assessment & Plan:  We will stop Metoprolol and go back to Lisinopril, but we will add HCTZ to it. Recheck in one month

## 2010-10-29 ENCOUNTER — Telehealth: Payer: Self-pay | Admitting: Family Medicine

## 2010-10-29 NOTE — Telephone Encounter (Signed)
I spoke with pt and the flu vaccine is recommended 1 x a year.

## 2010-10-29 NOTE — Telephone Encounter (Signed)
Pt called and said that she rcvd a flu shot last year in October and pt said that when she rcvd bill, it said flu vaccine 3 yrs and gt. Pt wants to know if this means it was a 3 yr vaccine or does she still get flu shot each year?

## 2010-12-15 ENCOUNTER — Ambulatory Visit (INDEPENDENT_AMBULATORY_CARE_PROVIDER_SITE_OTHER): Payer: Medicare Other

## 2010-12-15 DIAGNOSIS — Z23 Encounter for immunization: Secondary | ICD-10-CM

## 2011-01-08 ENCOUNTER — Telehealth: Payer: Self-pay | Admitting: Family Medicine

## 2011-01-08 NOTE — Telephone Encounter (Signed)
Pt has sch cpx on 03/17/11 at 10am. Pt is req to get an order to have her cpx labs drawn by [facility] like she had done last yr, since her insurance will not cover labs drawn here.

## 2011-01-12 NOTE — Telephone Encounter (Signed)
Done, please fax this in

## 2011-01-13 NOTE — Telephone Encounter (Signed)
Script was wrote for cbc with diff, bmet, ua, tsh, liver panel and lipids.( Diagnosis code V70.0 ) I tried to call pt and # not working. I put the script in mail to pt.

## 2011-02-16 ENCOUNTER — Other Ambulatory Visit: Payer: Self-pay | Admitting: Family Medicine

## 2011-02-16 DIAGNOSIS — Z1231 Encounter for screening mammogram for malignant neoplasm of breast: Secondary | ICD-10-CM

## 2011-03-05 ENCOUNTER — Other Ambulatory Visit: Payer: Self-pay | Admitting: Family Medicine

## 2011-03-05 LAB — HEPATIC FUNCTION PANEL
ALT: 22 U/L (ref 0–35)
AST: 19 U/L (ref 0–37)
Bilirubin, Direct: 0.1 mg/dL (ref 0.0–0.3)
Indirect Bilirubin: 0.5 mg/dL (ref 0.0–0.9)
Total Bilirubin: 0.6 mg/dL (ref 0.3–1.2)

## 2011-03-05 LAB — CBC WITH DIFFERENTIAL/PLATELET
Basophils Relative: 0 % (ref 0–1)
Eosinophils Absolute: 0.2 10*3/uL (ref 0.0–0.7)
HCT: 39.8 % (ref 36.0–46.0)
Hemoglobin: 13.3 g/dL (ref 12.0–15.0)
MCH: 32.3 pg (ref 26.0–34.0)
MCHC: 33.4 g/dL (ref 30.0–36.0)
MCV: 96.6 fL (ref 78.0–100.0)
Monocytes Absolute: 0.4 10*3/uL (ref 0.1–1.0)
Monocytes Relative: 8 % (ref 3–12)

## 2011-03-05 LAB — BASIC METABOLIC PANEL
BUN: 19 mg/dL (ref 6–23)
Calcium: 9.8 mg/dL (ref 8.4–10.5)
Glucose, Bld: 93 mg/dL (ref 70–99)

## 2011-03-05 LAB — LIPID PANEL
Cholesterol: 220 mg/dL — ABNORMAL HIGH (ref 0–200)
VLDL: 27 mg/dL (ref 0–40)

## 2011-03-06 LAB — URINALYSIS, ROUTINE W REFLEX MICROSCOPIC
Glucose, UA: NEGATIVE mg/dL
Hgb urine dipstick: NEGATIVE
Leukocytes, UA: NEGATIVE
Specific Gravity, Urine: 1.019 (ref 1.005–1.030)
pH: 5 (ref 5.0–8.0)

## 2011-03-10 NOTE — Progress Notes (Signed)
Quick Note:  Spoke with pt ______ 

## 2011-03-17 ENCOUNTER — Ambulatory Visit (INDEPENDENT_AMBULATORY_CARE_PROVIDER_SITE_OTHER): Payer: Medicare Other | Admitting: Family Medicine

## 2011-03-17 ENCOUNTER — Encounter: Payer: Self-pay | Admitting: Family Medicine

## 2011-03-17 VITALS — BP 116/80 | HR 84 | Temp 98.0°F | Ht 66.0 in | Wt 165.0 lb

## 2011-03-17 DIAGNOSIS — Z0000 Encounter for general adult medical examination without abnormal findings: Secondary | ICD-10-CM

## 2011-03-17 NOTE — Progress Notes (Signed)
  Subjective:    Patient ID: [name], female    DOB: 21-May-1945, 66 y.o.   MRN: [id number]  HPI 66 yr old female for a cpx. She feels fine and has no concerns.    Review of Systems  Constitutional: Negative.   HENT: Negative.   Eyes: Negative.   Respiratory: Negative.   Cardiovascular: Negative.   Gastrointestinal: Negative.   Genitourinary: Negative for dysuria, urgency, frequency, hematuria, flank pain, decreased urine volume, enuresis, difficulty urinating, pelvic pain and dyspareunia.  Musculoskeletal: Negative.   Skin: Negative.   Neurological: Negative.   Hematological: Negative.   Psychiatric/Behavioral: Negative.        Objective:   Physical Exam  Constitutional: She is oriented to person, place, and time. She appears well-developed and well-nourished. No distress.  HENT:  Head: Normocephalic and atraumatic.  Right Ear: External ear normal.  Left Ear: External ear normal.  Nose: Nose normal.  Mouth/Throat: Oropharynx is clear and moist. No oropharyngeal exudate.  Eyes: Conjunctivae and EOM are normal. Pupils are equal, round, and reactive to light. No scleral icterus.  Neck: Normal range of motion. Neck supple. No JVD present. No thyromegaly present.  Cardiovascular: Normal rate, regular rhythm, normal heart sounds and intact distal pulses.  Exam reveals no gallop and no friction rub.   No murmur heard.      EKG normal   Pulmonary/Chest: Effort normal and breath sounds normal. No respiratory distress. She has no wheezes. She has no rales. She exhibits no tenderness.  Abdominal: Soft. Bowel sounds are normal. She exhibits no distension and no mass. There is no tenderness. There is no rebound and no guarding.  Musculoskeletal: Normal range of motion. She exhibits no edema and no tenderness.  Lymphadenopathy:    She has no cervical adenopathy.  Neurological: She is alert and oriented to person, place, and time. She has normal reflexes. No cranial nerve deficit. She  exhibits normal muscle tone. Coordination normal.  Skin: Skin is warm and dry. No rash noted. No erythema.  Psychiatric: She has a normal mood and affect. Her behavior is normal. Judgment and thought content normal.          Assessment & Plan:  Well exam.

## 2011-03-30 ENCOUNTER — Ambulatory Visit
Admission: RE | Admit: 2011-03-30 | Discharge: 2011-03-30 | Disposition: A | Discharge status: Home / Self Care | Payer: Medicare Other | Source: Ambulatory Visit | Attending: Family Medicine | Admitting: Family Medicine

## 2011-03-30 DIAGNOSIS — Z1231 Encounter for screening mammogram for malignant neoplasm of breast: Secondary | ICD-10-CM

## 2011-06-19 ENCOUNTER — Other Ambulatory Visit: Payer: Self-pay | Admitting: Family Medicine

## 2011-06-19 ENCOUNTER — Ambulatory Visit (INDEPENDENT_AMBULATORY_CARE_PROVIDER_SITE_OTHER): Payer: Medicare Other | Admitting: Family Medicine

## 2011-06-19 ENCOUNTER — Encounter: Payer: Self-pay | Admitting: Family Medicine

## 2011-06-19 VITALS — BP 118/82 | HR 98 | Temp 98.2°F | Wt 163.0 lb

## 2011-06-19 DIAGNOSIS — L989 Disorder of the skin and subcutaneous tissue, unspecified: Secondary | ICD-10-CM

## 2011-06-19 DIAGNOSIS — D485 Neoplasm of uncertain behavior of skin: Secondary | ICD-10-CM

## 2011-06-19 NOTE — Progress Notes (Signed)
Addended by: Aniceto Boss A on: 06/19/2011 01:01 PM   Modules accepted: Orders

## 2011-06-19 NOTE — Progress Notes (Signed)
  Subjective:    Patient ID: [name], female    DOB: Dec 23, 1945, 66 y.o.   MRN: [id number]  HPI Here to remove a lesion on the left trunk which has grown slightly over the past year and which gets sore from rubbing on her bra.    Review of Systems  Constitutional: Negative.        Objective:   Physical Exam  Constitutional: She appears well-developed and well-nourished.  Skin:       The left trunk has a round flesh colored papular lesion about 0.8 cm across           Assessment & Plan:  The area was cleansed with Betadine and LA was achieved using 1% Xylocaine with epinephrine. An elliptical incision was made with a scalpel around the lesion down to the subcutaneous fat layer. The entire lesion was removed and sent to Pathology. The wound was closed using 5 sutures of 4-0 Ethilon. This was dressed with Neosporin and gauze. She will return in 14 days for suture removal

## 2011-06-19 NOTE — Progress Notes (Signed)
Addended by: Aniceto Boss A on: 06/19/2011 02:51 PM   Modules accepted: Orders

## 2011-06-29 NOTE — Progress Notes (Signed)
Quick Note:  I tried to reach pt by phone, no answer and I put a copy of results in mail. ______ 

## 2011-07-03 ENCOUNTER — Encounter: Payer: Self-pay | Admitting: Family Medicine

## 2011-07-03 ENCOUNTER — Ambulatory Visit (INDEPENDENT_AMBULATORY_CARE_PROVIDER_SITE_OTHER): Payer: Medicare Other | Admitting: Family Medicine

## 2011-07-03 VITALS — BP 104/76 | HR 84 | Temp 98.8°F | Wt 164.0 lb

## 2011-07-03 DIAGNOSIS — L989 Disorder of the skin and subcutaneous tissue, unspecified: Secondary | ICD-10-CM

## 2011-07-03 NOTE — Progress Notes (Signed)
  Subjective:    Patient ID: [name], female    DOB: 1946-02-19, 66 y.o.   MRN: [id number]  HPI Here to check the site of an excision done here 2 weeks ago. A lesion was removed, and the path report confirmed this was a benign seborrheic keratosis. The site has been a bit inflamed.    Review of Systems  Constitutional: Negative.   Skin: Positive for wound.       Objective:   Physical Exam  Constitutional: She appears well-developed and well-nourished.  Skin:       The suture site under the left breast is indeed mildly inflamed. It is healing well, no drainage, no signs of infection.           Assessment & Plan:  This appears to be some inflammation from the sutures. All sutures were removed today. Recheck prn

## 2011-07-03 NOTE — Progress Notes (Signed)
Addended by: Haze Boyden on: 07/03/2011 02:57 PM   Modules accepted: Orders

## 2011-10-31 ENCOUNTER — Other Ambulatory Visit: Payer: Self-pay | Admitting: Family Medicine

## 2011-12-18 ENCOUNTER — Ambulatory Visit (INDEPENDENT_AMBULATORY_CARE_PROVIDER_SITE_OTHER): Payer: Medicare Other

## 2011-12-18 DIAGNOSIS — Z23 Encounter for immunization: Secondary | ICD-10-CM

## 2012-01-18 ENCOUNTER — Telehealth: Payer: Self-pay | Admitting: Family Medicine

## 2012-01-18 NOTE — Telephone Encounter (Signed)
Pt needs an order for lab work sent to her for her CPX  on Mar 16, 2012. For insurance reasons.Thank you.

## 2012-01-19 NOTE — Telephone Encounter (Signed)
done

## 2012-01-20 NOTE — Telephone Encounter (Signed)
I spoke with pt and put in mail, per pt request. 

## 2012-02-08 ENCOUNTER — Other Ambulatory Visit: Payer: Self-pay | Admitting: Family Medicine

## 2012-02-25 ENCOUNTER — Other Ambulatory Visit: Payer: Self-pay | Admitting: Family Medicine

## 2012-02-25 DIAGNOSIS — Z1231 Encounter for screening mammogram for malignant neoplasm of breast: Secondary | ICD-10-CM

## 2012-03-04 ENCOUNTER — Other Ambulatory Visit: Payer: Self-pay | Admitting: Family Medicine

## 2012-03-04 LAB — CBC WITH DIFFERENTIAL/PLATELET
Basophils Absolute: 0 10*3/uL (ref 0.0–0.1)
Basophils Relative: 1 % (ref 0–1)
Eosinophils Absolute: 0.3 10*3/uL (ref 0.0–0.7)
Eosinophils Relative: 5 % (ref 0–5)
HCT: 40 % (ref 36.0–46.0)
MCH: 31.7 pg (ref 26.0–34.0)
MCHC: 34 g/dL (ref 30.0–36.0)
Monocytes Absolute: 0.4 10*3/uL (ref 0.1–1.0)
Monocytes Relative: 6 % (ref 3–12)
Neutro Abs: 3.7 10*3/uL (ref 1.7–7.7)
RDW: 13.4 % (ref 11.5–15.5)

## 2012-03-04 LAB — HEPATIC FUNCTION PANEL
ALT: 20 U/L (ref 0–35)
Alkaline Phosphatase: 84 U/L (ref 39–117)
Bilirubin, Direct: 0.1 mg/dL (ref 0.0–0.3)
Indirect Bilirubin: 0.5 mg/dL (ref 0.0–0.9)
Total Protein: 7.3 g/dL (ref 6.0–8.3)

## 2012-03-04 LAB — LIPID PANEL
Cholesterol: 237 mg/dL — ABNORMAL HIGH (ref 0–200)
Triglycerides: 156 mg/dL — ABNORMAL HIGH (ref <150)

## 2012-03-04 LAB — BASIC METABOLIC PANEL
BUN: 16 mg/dL (ref 6–23)
Calcium: 9.5 mg/dL (ref 8.4–10.5)
Creat: 0.72 mg/dL (ref 0.50–1.10)
Glucose, Bld: 93 mg/dL (ref 70–99)

## 2012-03-05 LAB — URINALYSIS, ROUTINE W REFLEX MICROSCOPIC
Bilirubin Urine: NEGATIVE
Glucose, UA: NEGATIVE mg/dL
Hgb urine dipstick: NEGATIVE
Leukocytes, UA: NEGATIVE
Protein, ur: NEGATIVE mg/dL
pH: 6 (ref 5.0–8.0)

## 2012-03-09 NOTE — Progress Notes (Signed)
Quick Note:  I spoke with pt ______ 

## 2012-03-16 ENCOUNTER — Encounter: Payer: Self-pay | Admitting: Family Medicine

## 2012-03-16 ENCOUNTER — Ambulatory Visit (INDEPENDENT_AMBULATORY_CARE_PROVIDER_SITE_OTHER): Payer: Medicare Other | Admitting: Family Medicine

## 2012-03-16 VITALS — BP 122/78 | HR 113 | Temp 98.5°F | Ht 66.5 in | Wt 164.0 lb

## 2012-03-16 DIAGNOSIS — Z0000 Encounter for general adult medical examination without abnormal findings: Secondary | ICD-10-CM

## 2012-03-16 NOTE — Progress Notes (Signed)
  Subjective:    Patient ID: [name], female    DOB: 1945-12-20, 67 y.o.   MRN: [id number]  HPI 67 yr old female for a cpx. She feels well and has no concerns. Her last DEXA was normal in [place] about 5 yrs ago. She recently saw Dr. Aldona Bar for a GYN exam.    Review of Systems  Constitutional: Negative.   HENT: Negative.   Eyes: Negative.   Respiratory: Negative.   Cardiovascular: Negative.   Gastrointestinal: Negative.   Genitourinary: Negative for dysuria, urgency, frequency, hematuria, flank pain, decreased urine volume, enuresis, difficulty urinating, pelvic pain and dyspareunia.  Musculoskeletal: Negative.   Skin: Negative.   Neurological: Negative.   Hematological: Negative.   Psychiatric/Behavioral: Negative.        Objective:   Physical Exam  Constitutional: She is oriented to person, place, and time. She appears well-developed and well-nourished. No distress.  HENT:  Head: Normocephalic and atraumatic.  Right Ear: External ear normal.  Left Ear: External ear normal.  Nose: Nose normal.  Mouth/Throat: Oropharynx is clear and moist. No oropharyngeal exudate.  Eyes: Conjunctivae normal and EOM are normal. Pupils are equal, round, and reactive to light. No scleral icterus.  Neck: Normal range of motion. Neck supple. No JVD present. No thyromegaly present.  Cardiovascular: Normal rate, regular rhythm, normal heart sounds and intact distal pulses.  Exam reveals no gallop and no friction rub.   No murmur heard.      EKG normal with a single PVC  Pulmonary/Chest: Effort normal and breath sounds normal. No respiratory distress. She has no wheezes. She has no rales. She exhibits no tenderness.  Abdominal: Soft. Bowel sounds are normal. She exhibits no distension and no mass. There is no tenderness. There is no rebound and no guarding.  Musculoskeletal: Normal range of motion. She exhibits no edema and no tenderness.  Lymphadenopathy:    She has no cervical adenopathy.    Neurological: She is alert and oriented to person, place, and time. She has normal reflexes. No cranial nerve deficit. She exhibits normal muscle tone. Coordination normal.  Skin: Skin is warm and dry. No rash noted. No erythema.  Psychiatric: She has a normal mood and affect. Her behavior is normal. Judgment and thought content normal.          Assessment & Plan:  Well exam. Set up a DEXA.

## 2012-03-31 ENCOUNTER — Ambulatory Visit
Admission: RE | Admit: 2012-03-31 | Discharge: 2012-03-31 | Disposition: A | Discharge status: Home / Self Care | Payer: Medicare Other | Source: Ambulatory Visit | Attending: Family Medicine | Admitting: Family Medicine

## 2012-03-31 DIAGNOSIS — Z1231 Encounter for screening mammogram for malignant neoplasm of breast: Secondary | ICD-10-CM

## 2012-04-20 ENCOUNTER — Telehealth: Payer: Self-pay | Admitting: Family Medicine

## 2012-04-20 NOTE — Telephone Encounter (Signed)
Pt would like a copy of her mammogram done 03/31/12. Thank you.

## 2012-04-20 NOTE — Telephone Encounter (Signed)
I put a copy in mail to pt.

## 2012-05-27 ENCOUNTER — Encounter: Payer: Self-pay | Admitting: Family Medicine

## 2012-05-27 ENCOUNTER — Ambulatory Visit (INDEPENDENT_AMBULATORY_CARE_PROVIDER_SITE_OTHER): Payer: Medicare Other | Admitting: Family Medicine

## 2012-05-27 VITALS — BP 124/80 | HR 98 | Temp 98.6°F | Wt 164.0 lb

## 2012-05-27 DIAGNOSIS — J209 Acute bronchitis, unspecified: Secondary | ICD-10-CM

## 2012-05-27 MED ORDER — AZITHROMYCIN 250 MG PO TABS: ORAL_TABLET | ORAL | Status: DC

## 2012-05-27 NOTE — Progress Notes (Signed)
  Subjective:    Patient ID: [name], female    DOB: 06/09/1945, 67 y.o.   MRN: [id number]  HPI Here for one week of burning in the chest with a cough producing yellow sputum . No fever or SOB.    Review of Systems  Constitutional: Negative.   HENT: Negative.   Respiratory: Positive for cough and chest tightness. Negative for shortness of breath and wheezing.   Cardiovascular: Negative.        Objective:   Physical Exam  Constitutional: She appears well-developed and well-nourished. No distress.  HENT:  Right Ear: External ear normal.  Left Ear: External ear normal.  Nose: Nose normal.  Mouth/Throat: Oropharynx is clear and moist.  Eyes: Conjunctivae are normal.  Pulmonary/Chest: Effort normal. No respiratory distress. She has no wheezes. She has no rales.  Scattered rhonchi   Lymphadenopathy:    She has no cervical adenopathy.          Assessment & Plan:  Drink fluids. Add Robitussin prn

## 2012-06-02 ENCOUNTER — Telehealth: Payer: Self-pay | Admitting: Family Medicine

## 2012-06-02 MED ORDER — AMOXICILLIN-POT CLAVULANATE 875-125 MG PO TABS: 1.0000 | ORAL_TABLET | Freq: Two times a day (BID) | ORAL | Status: DC

## 2012-06-02 MED ORDER — HYDROCODONE-HOMATROPINE 5-1.5 MG/5ML PO SYRP: 5.0000 mL | ORAL_SOLUTION | ORAL | Status: DC | PRN

## 2012-06-02 NOTE — Telephone Encounter (Signed)
I sent both scripts in and spoke with pt.

## 2012-06-02 NOTE — Telephone Encounter (Signed)
Pt states she is still coughing and has congestion. Pt still has productive cough. Pt does not want to come in, would like to know if you could/would  call in another round of antibiotics and/or cough med. Pls advise. Pharm[facility]

## 2012-06-02 NOTE — Telephone Encounter (Signed)
Call in Augmentin 875 bid for 10 days, also Hydromet 5 ml q 4 hours prn cough, 240 ml

## 2012-06-02 NOTE — Telephone Encounter (Signed)
done

## 2012-12-08 ENCOUNTER — Other Ambulatory Visit: Payer: Self-pay | Admitting: Family Medicine

## 2012-12-13 ENCOUNTER — Ambulatory Visit (INDEPENDENT_AMBULATORY_CARE_PROVIDER_SITE_OTHER): Payer: Medicare Other

## 2012-12-13 DIAGNOSIS — Z23 Encounter for immunization: Secondary | ICD-10-CM

## 2013-01-24 ENCOUNTER — Encounter: Payer: Self-pay | Admitting: Family Medicine

## 2013-01-24 ENCOUNTER — Ambulatory Visit (INDEPENDENT_AMBULATORY_CARE_PROVIDER_SITE_OTHER): Payer: Medicare Other | Admitting: Family Medicine

## 2013-01-24 VITALS — BP 110/74 | HR 95 | Temp 98.3°F | Wt 159.0 lb

## 2013-01-24 DIAGNOSIS — M7661 Achilles tendinitis, right leg: Secondary | ICD-10-CM

## 2013-01-24 DIAGNOSIS — M7660 Achilles tendinitis, unspecified leg: Secondary | ICD-10-CM

## 2013-01-24 DIAGNOSIS — IMO0002 Reserved for concepts with insufficient information to code with codable children: Secondary | ICD-10-CM

## 2013-01-24 DIAGNOSIS — S86899A Other injury of other muscle(s) and tendon(s) at lower leg level, unspecified leg, initial encounter: Secondary | ICD-10-CM

## 2013-01-24 NOTE — Progress Notes (Signed)
  Subjective:    Patient ID: [name], female    DOB: 1946-02-27, 67 y.o.   MRN: [id number]  HPI Here for several months of pain in the back of the right heel and pain in the anterior left shin. No swelling, no recent trauma. She walks daily for exercise but wears flat loafers.    Review of Systems  Constitutional: Negative.   Musculoskeletal: Positive for arthralgias and myalgias.       Objective:   Physical Exam  Constitutional: She appears well-developed and well-nourished.  Normal gait   Musculoskeletal:  The insertion of the left Achilles onto the left heel is swollen and mildly tender. The left shin area is mildly tender but not swollen.           Assessment & Plan:  Most of these problems are caused by not wearing supportive footwear. Advised her to buy some athletic walking shoes for walking. Use ice on both areas to reduce inflammation. She does not want to takes anti-inflammatory meds since they upset her stomach. Recheck prn

## 2013-01-24 NOTE — Progress Notes (Signed)
Pre visit review using our clinic review tool, if applicable. No additional management support is needed unless otherwise documented below in the visit note. 

## 2013-02-16 ENCOUNTER — Telehealth: Payer: Self-pay | Admitting: Family Medicine

## 2013-02-16 NOTE — Telephone Encounter (Signed)
Can we order these?

## 2013-02-16 NOTE — Telephone Encounter (Signed)
Pt is sch for cpx on 03-29-13. Pt stated she can not use our lab for bloodwork and that sylvia mail her lab order to go elsewhere.

## 2013-02-17 NOTE — Telephone Encounter (Signed)
I wrote a rx for her labs which we can fax

## 2013-02-17 NOTE — Telephone Encounter (Signed)
I spoke with pt and put script for labs in the mail.

## 2013-03-06 ENCOUNTER — Other Ambulatory Visit: Payer: Self-pay

## 2013-03-06 DIAGNOSIS — Z1231 Encounter for screening mammogram for malignant neoplasm of breast: Secondary | ICD-10-CM

## 2013-03-16 ENCOUNTER — Other Ambulatory Visit: Payer: Self-pay | Admitting: Family Medicine

## 2013-03-16 LAB — CBC WITH DIFFERENTIAL/PLATELET
BASOS ABS: 0 10*3/uL (ref 0.0–0.1)
Basophils Relative: 1 % (ref 0–1)
EOS PCT: 4 % (ref 0–5)
Eosinophils Absolute: 0.2 10*3/uL (ref 0.0–0.7)
HEMATOCRIT: 40.6 % (ref 36.0–46.0)
HEMOGLOBIN: 13.7 g/dL (ref 12.0–15.0)
LYMPHS PCT: 31 % (ref 12–46)
Lymphs Abs: 1.7 10*3/uL (ref 0.7–4.0)
MCH: 31.1 pg (ref 26.0–34.0)
MCHC: 33.7 g/dL (ref 30.0–36.0)
MCV: 92.1 fL (ref 78.0–100.0)
MONO ABS: 0.4 10*3/uL (ref 0.1–1.0)
MONOS PCT: 7 % (ref 3–12)
Neutro Abs: 3.1 10*3/uL (ref 1.7–7.7)
Neutrophils Relative %: 57 % (ref 43–77)
Platelets: 297 10*3/uL (ref 150–400)
RBC: 4.41 MIL/uL (ref 3.87–5.11)
RDW: 13 % (ref 11.5–15.5)
WBC: 5.5 10*3/uL (ref 4.0–10.5)

## 2013-03-16 LAB — LIPID PANEL
Cholesterol: 212 mg/dL — ABNORMAL HIGH (ref 0–200)
HDL: 64 mg/dL (ref >39)
LDL CALC: 126 mg/dL — AB (ref 0–99)
TRIGLYCERIDES: 110 mg/dL (ref <150)
Total CHOL/HDL Ratio: 3.3 Ratio
VLDL: 22 mg/dL (ref 0–40)

## 2013-03-16 LAB — BASIC METABOLIC PANEL
BUN: 18 mg/dL (ref 6–23)
CHLORIDE: 100 meq/L (ref 96–112)
CO2: 30 mEq/L (ref 19–32)
CREATININE: 0.71 mg/dL (ref 0.50–1.10)
Calcium: 9.3 mg/dL (ref 8.4–10.5)
GLUCOSE: 88 mg/dL (ref 70–99)
Potassium: 4.1 mEq/L (ref 3.5–5.3)
Sodium: 138 mEq/L (ref 135–145)

## 2013-03-16 LAB — HEPATIC FUNCTION PANEL
ALK PHOS: 118 U/L — AB (ref 39–117)
ALT: 24 U/L (ref 0–35)
AST: 23 U/L (ref 0–37)
Albumin: 4.3 g/dL (ref 3.5–5.2)
BILIRUBIN DIRECT: 0.1 mg/dL (ref 0.0–0.3)
BILIRUBIN INDIRECT: 0.5 mg/dL (ref 0.0–0.9)
BILIRUBIN TOTAL: 0.6 mg/dL (ref 0.3–1.2)
TOTAL PROTEIN: 7 g/dL (ref 6.0–8.3)

## 2013-03-16 LAB — TSH: TSH: 2.594 u[IU]/mL (ref 0.350–4.500)

## 2013-03-17 LAB — URINALYSIS, ROUTINE W REFLEX MICROSCOPIC
Bilirubin Urine: NEGATIVE
GLUCOSE, UA: NEGATIVE mg/dL
Hgb urine dipstick: NEGATIVE
Ketones, ur: NEGATIVE mg/dL
Leukocytes, UA: NEGATIVE
NITRITE: NEGATIVE
PH: 7 (ref 5.0–8.0)
Protein, ur: NEGATIVE mg/dL
SPECIFIC GRAVITY, URINE: 1.017 (ref 1.005–1.030)
Urobilinogen, UA: 0.2 mg/dL (ref 0.0–1.0)

## 2013-03-29 ENCOUNTER — Encounter: Payer: Self-pay | Admitting: Family Medicine

## 2013-03-29 ENCOUNTER — Ambulatory Visit (INDEPENDENT_AMBULATORY_CARE_PROVIDER_SITE_OTHER): Payer: Medicare Other | Admitting: Family Medicine

## 2013-03-29 VITALS — BP 140/80 | HR 82 | Temp 98.5°F | Ht 66.25 in | Wt 162.0 lb

## 2013-03-29 DIAGNOSIS — L989 Disorder of the skin and subcutaneous tissue, unspecified: Secondary | ICD-10-CM

## 2013-03-29 DIAGNOSIS — Z0000 Encounter for general adult medical examination without abnormal findings: Secondary | ICD-10-CM

## 2013-03-29 DIAGNOSIS — Z23 Encounter for immunization: Secondary | ICD-10-CM

## 2013-03-29 DIAGNOSIS — I10 Essential (primary) hypertension: Secondary | ICD-10-CM

## 2013-03-29 NOTE — Progress Notes (Signed)
   Subjective:    Patient ID: [name], female    DOB: [birth year], 68 y.o.   MRN: [id number]  HPI 68 yr old female for a cpx. She feels well in general. She is concerned about a red spot on the right cheek which appeared 2 weeks ago. She has been scratching it.    Review of Systems  Constitutional: Negative.   HENT: Negative.   Eyes: Negative.   Respiratory: Negative.   Cardiovascular: Negative.   Gastrointestinal: Negative.   Genitourinary: Negative for dysuria, urgency, frequency, hematuria, flank pain, decreased urine volume, enuresis, difficulty urinating, pelvic pain and dyspareunia.  Musculoskeletal: Negative.   Skin: Negative.   Neurological: Negative.   Psychiatric/Behavioral: Negative.        Objective:   Physical Exam  Constitutional: She is oriented to person, place, and time. She appears well-developed and well-nourished. No distress.  HENT:  Head: Normocephalic and atraumatic.  Right Ear: External ear normal.  Left Ear: External ear normal.  Nose: Nose normal.  Mouth/Throat: Oropharynx is clear and moist. No oropharyngeal exudate.  Eyes: Conjunctivae and EOM are normal. Pupils are equal, round, and reactive to light. No scleral icterus.  Neck: Normal range of motion. Neck supple. No JVD present. No thyromegaly present.  Cardiovascular: Normal rate, regular rhythm, normal heart sounds and intact distal pulses.  Exam reveals no gallop and no friction rub.   No murmur heard. EKG normal   Pulmonary/Chest: Effort normal and breath sounds normal. No respiratory distress. She has no wheezes. She has no rales. She exhibits no tenderness.  Abdominal: Soft. Bowel sounds are normal. She exhibits no distension and no mass. There is no tenderness. There is no rebound and no guarding.  Musculoskeletal: Normal range of motion. She exhibits no edema and no tenderness.  Lymphadenopathy:    She has no cervical adenopathy.  Neurological: She is alert and oriented to person,  place, and time. She has normal reflexes. No cranial nerve deficit. She exhibits normal muscle tone. Coordination normal.  Skin: Skin is warm and dry. No rash noted. No erythema.  The right cheek has an excoriated lesion about 7 mm in diameter. This is scaly and red, with irregular borders   Psychiatric: She has a normal mood and affect. Her behavior is normal. Judgment and thought content normal.          Assessment & Plan:  Well exam. Refer to Dermatology for the cheek lesion

## 2013-03-29 NOTE — Progress Notes (Signed)
Pre visit review using our clinic review tool, if applicable. No additional management support is needed unless otherwise documented below in the visit note. 

## 2013-04-03 ENCOUNTER — Ambulatory Visit
Admission: RE | Admit: 2013-04-03 | Discharge: 2013-04-03 | Disposition: A | Discharge status: Home / Self Care | Payer: Self-pay | Source: Ambulatory Visit

## 2013-04-03 ENCOUNTER — Telehealth: Payer: Self-pay | Admitting: Family Medicine

## 2013-04-03 DIAGNOSIS — Z1231 Encounter for screening mammogram for malignant neoplasm of breast: Secondary | ICD-10-CM

## 2013-04-03 NOTE — Telephone Encounter (Signed)
Relevant patient education mailed to patient.  

## 2013-04-05 ENCOUNTER — Telehealth: Payer: Self-pay | Admitting: Family Medicine

## 2013-04-05 DIAGNOSIS — M8580 Other specified disorders of bone density and structure, unspecified site: Secondary | ICD-10-CM

## 2013-04-05 NOTE — Telephone Encounter (Signed)
Pt would like to know should she have a bone density test. Pt last bone density was over 5 yrs in [place].

## 2013-04-05 NOTE — Telephone Encounter (Signed)
I put in the order for a DEXA so they should be calling her

## 2013-04-06 NOTE — Telephone Encounter (Signed)
I left a voice message with below information. 

## 2013-04-20 ENCOUNTER — Ambulatory Visit
Admission: RE | Admit: 2013-04-20 | Discharge: 2013-04-20 | Disposition: A | Discharge status: Home / Self Care | Payer: Medicare Other | Source: Ambulatory Visit | Attending: Family Medicine | Admitting: Family Medicine

## 2013-04-20 DIAGNOSIS — M8580 Other specified disorders of bone density and structure, unspecified site: Secondary | ICD-10-CM

## 2013-04-21 ENCOUNTER — Telehealth: Payer: Self-pay | Admitting: Family Medicine

## 2013-04-21 ENCOUNTER — Other Ambulatory Visit: Payer: Self-pay | Admitting: Family Medicine

## 2013-04-21 NOTE — Telephone Encounter (Signed)
I printed both and put in the mail to pt.

## 2013-04-21 NOTE — Telephone Encounter (Signed)
Pt wants a copy of bone density scan & mammogram, put in mail to pt.

## 2013-08-02 ENCOUNTER — Telehealth: Payer: Self-pay | Admitting: Family Medicine

## 2013-08-02 DIAGNOSIS — M25569 Pain in unspecified knee: Secondary | ICD-10-CM

## 2013-08-02 NOTE — Telephone Encounter (Signed)
I spoke with pt and let her know about the referral. Can you check into the coding issue? Pt's labs had to go to [place], not sure if that has anything to do with the coding or not?

## 2013-08-02 NOTE — Telephone Encounter (Signed)
The referral was done. Please refer her to [place] about the billing issue

## 2013-08-02 NOTE — Telephone Encounter (Signed)
Pt states she needs a referral for her knee, states her knee is bothering her more now than when she last seen him in Jan.  Also states that her insurance is charging her for labs that was done in Jan, states that the insurance is stating there was not coded correctly or not enough coding for the bill to be covered, pt needs to know what to do to correct it.

## 2013-08-11 NOTE — Telephone Encounter (Signed)
Spoke with patient and requested her to provide a copy of letter she received from insurance company as [place] shows she does not have a balance for this date of service.  Pt states she will drop off copy of letter some time next week.

## 2013-12-15 ENCOUNTER — Ambulatory Visit (INDEPENDENT_AMBULATORY_CARE_PROVIDER_SITE_OTHER): Payer: Medicare Other

## 2013-12-15 DIAGNOSIS — Z23 Encounter for immunization: Secondary | ICD-10-CM

## 2014-01-29 ENCOUNTER — Other Ambulatory Visit: Payer: Self-pay | Admitting: Family Medicine

## 2014-02-07 ENCOUNTER — Telehealth: Payer: Self-pay | Admitting: Family Medicine

## 2014-02-07 NOTE — Telephone Encounter (Signed)
Pt called and scheduled her physical she request that an order for her labs be mailed to her. She said she have her labs done else where.

## 2014-02-08 NOTE — Telephone Encounter (Signed)
Done, ready to be faxed.

## 2014-02-08 NOTE — Telephone Encounter (Signed)
I put script in mail to pt, per her request.

## 2014-02-19 ENCOUNTER — Telehealth: Payer: Self-pay | Admitting: Family Medicine

## 2014-02-19 NOTE — Telephone Encounter (Signed)
Pt has not received the lab rx in the mail yet. Mailed 12/10. Pt will cb jan 1 if she has not received by then.

## 2014-02-21 NOTE — Telephone Encounter (Signed)
I put script in mail and spoke with pt, also have a copy at my desk.

## 2014-02-21 NOTE — Telephone Encounter (Signed)
Pt has not received lab rx

## 2014-02-21 NOTE — Telephone Encounter (Signed)
Done again

## 2014-03-07 ENCOUNTER — Other Ambulatory Visit: Payer: Self-pay

## 2014-03-07 DIAGNOSIS — Z1231 Encounter for screening mammogram for malignant neoplasm of breast: Secondary | ICD-10-CM

## 2014-03-21 ENCOUNTER — Other Ambulatory Visit: Payer: Self-pay | Admitting: Family Medicine

## 2014-03-21 LAB — BASIC METABOLIC PANEL
BUN: 17 mg/dL (ref 6–23)
CO2: 27 mEq/L (ref 19–32)
CREATININE: 0.72 mg/dL (ref 0.50–1.10)
Calcium: 9.7 mg/dL (ref 8.4–10.5)
Chloride: 100 mEq/L (ref 96–112)
GLUCOSE: 98 mg/dL (ref 70–99)
Potassium: 4.3 mEq/L (ref 3.5–5.3)
Sodium: 139 mEq/L (ref 135–145)

## 2014-03-21 LAB — LIPID PANEL
CHOLESTEROL: 236 mg/dL — AB (ref 0–200)
HDL: 67 mg/dL (ref >39)
LDL Cholesterol: 142 mg/dL — ABNORMAL HIGH (ref 0–99)
Total CHOL/HDL Ratio: 3.5 Ratio
Triglycerides: 137 mg/dL (ref <150)
VLDL: 27 mg/dL (ref 0–40)

## 2014-03-21 LAB — CBC WITH DIFFERENTIAL/PLATELET
Basophils Absolute: 0.1 10*3/uL (ref 0.0–0.1)
Basophils Relative: 1 % (ref 0–1)
Eosinophils Absolute: 0.2 10*3/uL (ref 0.0–0.7)
Eosinophils Relative: 3 % (ref 0–5)
HEMATOCRIT: 41.8 % (ref 36.0–46.0)
HEMOGLOBIN: 13.9 g/dL (ref 12.0–15.0)
LYMPHS ABS: 1.8 10*3/uL (ref 0.7–4.0)
LYMPHS PCT: 29 % (ref 12–46)
MCH: 31.4 pg (ref 26.0–34.0)
MCHC: 33.3 g/dL (ref 30.0–36.0)
MCV: 94.4 fL (ref 78.0–100.0)
MONOS PCT: 7 % (ref 3–12)
MPV: 9.5 fL (ref 8.6–12.4)
Monocytes Absolute: 0.4 10*3/uL (ref 0.1–1.0)
NEUTROS ABS: 3.7 10*3/uL (ref 1.7–7.7)
NEUTROS PCT: 60 % (ref 43–77)
Platelets: 296 10*3/uL (ref 150–400)
RBC: 4.43 MIL/uL (ref 3.87–5.11)
RDW: 13.2 % (ref 11.5–15.5)
WBC: 6.1 10*3/uL (ref 4.0–10.5)

## 2014-03-21 LAB — HEPATIC FUNCTION PANEL
ALBUMIN: 4.4 g/dL (ref 3.5–5.2)
ALK PHOS: 109 U/L (ref 39–117)
ALT: 25 U/L (ref 0–35)
AST: 25 U/L (ref 0–37)
BILIRUBIN INDIRECT: 0.6 mg/dL (ref 0.2–1.2)
BILIRUBIN TOTAL: 0.7 mg/dL (ref 0.2–1.2)
Bilirubin, Direct: 0.1 mg/dL (ref 0.0–0.3)
TOTAL PROTEIN: 7.8 g/dL (ref 6.0–8.3)

## 2014-03-21 LAB — TSH: TSH: 3.378 u[IU]/mL (ref 0.350–4.500)

## 2014-03-22 LAB — URINALYSIS, ROUTINE W REFLEX MICROSCOPIC
Bilirubin Urine: NEGATIVE
Glucose, UA: NEGATIVE mg/dL
HGB URINE DIPSTICK: NEGATIVE
Ketones, ur: NEGATIVE mg/dL
LEUKOCYTES UA: NEGATIVE
Nitrite: NEGATIVE
PH: 5 (ref 5.0–8.0)
Protein, ur: NEGATIVE mg/dL
Specific Gravity, Urine: 1.018 (ref 1.005–1.030)
Urobilinogen, UA: 0.2 mg/dL (ref 0.0–1.0)

## 2014-04-02 ENCOUNTER — Ambulatory Visit (INDEPENDENT_AMBULATORY_CARE_PROVIDER_SITE_OTHER): Payer: Medicare Other | Admitting: Family Medicine

## 2014-04-02 ENCOUNTER — Encounter: Payer: Self-pay | Admitting: Family Medicine

## 2014-04-02 VITALS — BP 133/81 | HR 82 | Temp 98.6°F | Ht 66.25 in | Wt 166.0 lb

## 2014-04-02 DIAGNOSIS — I10 Essential (primary) hypertension: Secondary | ICD-10-CM | POA: Diagnosis not present

## 2014-04-02 DIAGNOSIS — Z0000 Encounter for general adult medical examination without abnormal findings: Secondary | ICD-10-CM | POA: Diagnosis not present

## 2014-04-02 MED ORDER — OMEPRAZOLE 40 MG PO CPDR: 40.0000 mg | DELAYED_RELEASE_CAPSULE | Freq: Every day | ORAL | Status: DC

## 2014-04-02 MED ORDER — LISINOPRIL-HYDROCHLOROTHIAZIDE 10-12.5 MG PO TABS: 1.0000 | ORAL_TABLET | Freq: Every day | ORAL | Status: DC

## 2014-04-02 NOTE — Progress Notes (Signed)
Pre visit review using our clinic review tool, if applicable. No additional management support is needed unless otherwise documented below in the visit note. 

## 2014-04-02 NOTE — Progress Notes (Signed)
   Subjective:    Patient ID: [name], female    DOB: 07-02-1945, 69 y.o.   MRN: [id number]  HPI 69 yr female for a cpx. She feels well.    Review of Systems  Constitutional: Negative.   HENT: Negative.   Eyes: Negative.   Respiratory: Negative.   Cardiovascular: Negative.   Gastrointestinal: Negative.   Genitourinary: Negative for dysuria, urgency, frequency, hematuria, flank pain, decreased urine volume, enuresis, difficulty urinating, pelvic pain and dyspareunia.  Musculoskeletal: Negative.   Skin: Negative.   Neurological: Negative.   Psychiatric/Behavioral: Negative.        Objective:   Physical Exam  Constitutional: She is oriented to person, place, and time. She appears well-developed and well-nourished. No distress.  HENT:  Head: Normocephalic and atraumatic.  Right Ear: External ear normal.  Left Ear: External ear normal.  Nose: Nose normal.  Mouth/Throat: Oropharynx is clear and moist. No oropharyngeal exudate.  Eyes: Conjunctivae and EOM are normal. Pupils are equal, round, and reactive to light. No scleral icterus.  Neck: Normal range of motion. Neck supple. No JVD present. No thyromegaly present.  Cardiovascular: Normal rate, regular rhythm, normal heart sounds and intact distal pulses.  Exam reveals no gallop and no friction rub.   No murmur heard. EKG normal   Pulmonary/Chest: Effort normal and breath sounds normal. No respiratory distress. She has no wheezes. She has no rales. She exhibits no tenderness.  Abdominal: Soft. Bowel sounds are normal. She exhibits no distension and no mass. There is no tenderness. There is no rebound and no guarding.  Musculoskeletal: Normal range of motion. She exhibits no edema or tenderness.  Lymphadenopathy:    She has no cervical adenopathy.  Neurological: She is alert and oriented to person, place, and time. She has normal reflexes. No cranial nerve deficit. She exhibits normal muscle tone. Coordination normal.  Skin:  Skin is warm and dry. No rash noted. No erythema.  Psychiatric: She has a normal mood and affect. Her behavior is normal. Judgment and thought content normal.          Assessment & Plan:  Well exam.

## 2014-04-12 ENCOUNTER — Ambulatory Visit
Admission: RE | Admit: 2014-04-12 | Discharge: 2014-04-12 | Disposition: A | Discharge status: Home / Self Care | Payer: Medicare Other | Source: Ambulatory Visit

## 2014-04-12 DIAGNOSIS — Z1231 Encounter for screening mammogram for malignant neoplasm of breast: Secondary | ICD-10-CM

## 2014-05-03 ENCOUNTER — Telehealth: Payer: Self-pay | Admitting: Family Medicine

## 2014-05-03 NOTE — Telephone Encounter (Signed)
I printed a copy of results and put in mail to pt.

## 2014-05-03 NOTE — Telephone Encounter (Signed)
Pt would like a copy of her mammogram results mailed to her home.

## 2014-12-20 ENCOUNTER — Ambulatory Visit (INDEPENDENT_AMBULATORY_CARE_PROVIDER_SITE_OTHER): Payer: Medicare Other

## 2014-12-20 DIAGNOSIS — Z23 Encounter for immunization: Secondary | ICD-10-CM

## 2015-03-14 ENCOUNTER — Telehealth: Payer: Self-pay | Admitting: Family Medicine

## 2015-03-14 NOTE — Telephone Encounter (Signed)
Ready to pick up.  

## 2015-03-14 NOTE — Telephone Encounter (Signed)
Pt is asking if you will send her the orders to have her labs done else where. She said you always send the orders to her .

## 2015-03-15 NOTE — Telephone Encounter (Signed)
I spoke with pt and put script in the mail, per pt request.

## 2015-03-18 ENCOUNTER — Other Ambulatory Visit: Payer: Self-pay

## 2015-03-18 DIAGNOSIS — Z1231 Encounter for screening mammogram for malignant neoplasm of breast: Secondary | ICD-10-CM

## 2015-04-02 ENCOUNTER — Other Ambulatory Visit: Payer: Self-pay | Admitting: Family Medicine

## 2015-04-02 LAB — URINALYSIS, ROUTINE W REFLEX MICROSCOPIC
Bilirubin Urine: NEGATIVE
Glucose, UA: NEGATIVE
Hgb urine dipstick: NEGATIVE
KETONES UR: NEGATIVE
Leukocytes, UA: NEGATIVE
Nitrite: NEGATIVE
Protein, ur: NEGATIVE
Specific Gravity, Urine: 1.018 (ref 1.001–1.035)
pH: 6.5 (ref 5.0–8.0)

## 2015-04-02 LAB — HEPATIC FUNCTION PANEL
ALT: 20 U/L (ref 6–29)
AST: 19 U/L (ref 10–35)
Albumin: 3.9 g/dL (ref 3.6–5.1)
Alkaline Phosphatase: 74 U/L (ref 33–130)
BILIRUBIN INDIRECT: 0.5 mg/dL (ref 0.2–1.2)
BILIRUBIN TOTAL: 0.6 mg/dL (ref 0.2–1.2)
Bilirubin, Direct: 0.1 mg/dL (ref <=0.2)
TOTAL PROTEIN: 7 g/dL (ref 6.1–8.1)

## 2015-04-02 LAB — BASIC METABOLIC PANEL
BUN: 16 mg/dL (ref 7–25)
CHLORIDE: 101 mmol/L (ref 98–110)
CO2: 28 mmol/L (ref 20–31)
Calcium: 9.3 mg/dL (ref 8.6–10.4)
Creat: 0.74 mg/dL (ref 0.60–0.93)
Glucose, Bld: 87 mg/dL (ref 65–99)
POTASSIUM: 4.3 mmol/L (ref 3.5–5.3)
Sodium: 137 mmol/L (ref 135–146)

## 2015-04-02 LAB — CBC WITH DIFFERENTIAL/PLATELET
BASOS ABS: 0.1 10*3/uL (ref 0.0–0.1)
Basophils Relative: 1 % (ref 0–1)
EOS PCT: 4 % (ref 0–5)
Eosinophils Absolute: 0.2 10*3/uL (ref 0.0–0.7)
HEMATOCRIT: 39.6 % (ref 36.0–46.0)
HEMOGLOBIN: 13.2 g/dL (ref 12.0–15.0)
LYMPHS PCT: 32 % (ref 12–46)
Lymphs Abs: 1.9 10*3/uL (ref 0.7–4.0)
MCH: 31.2 pg (ref 26.0–34.0)
MCHC: 33.3 g/dL (ref 30.0–36.0)
MCV: 93.6 fL (ref 78.0–100.0)
MPV: 9.7 fL (ref 8.6–12.4)
Monocytes Absolute: 0.5 10*3/uL (ref 0.1–1.0)
Monocytes Relative: 8 % (ref 3–12)
NEUTROS ABS: 3.2 10*3/uL (ref 1.7–7.7)
NEUTROS PCT: 55 % (ref 43–77)
Platelets: 269 10*3/uL (ref 150–400)
RBC: 4.23 MIL/uL (ref 3.87–5.11)
RDW: 13.7 % (ref 11.5–15.5)
WBC: 5.9 10*3/uL (ref 4.0–10.5)

## 2015-04-02 LAB — LIPID PANEL
Cholesterol: 231 mg/dL — ABNORMAL HIGH (ref 125–200)
HDL: 66 mg/dL (ref >=46)
LDL CALC: 142 mg/dL — AB (ref <130)
TRIGLYCERIDES: 113 mg/dL (ref <150)
Total CHOL/HDL Ratio: 3.5 Ratio (ref <=5.0)
VLDL: 23 mg/dL (ref <30)

## 2015-04-02 LAB — TSH: TSH: 3.276 u[IU]/mL (ref 0.350–4.500)

## 2015-04-10 ENCOUNTER — Encounter: Payer: Self-pay | Admitting: Family Medicine

## 2015-04-10 ENCOUNTER — Ambulatory Visit (INDEPENDENT_AMBULATORY_CARE_PROVIDER_SITE_OTHER): Payer: Medicare Other | Admitting: Family Medicine

## 2015-04-10 VITALS — BP 116/85 | HR 94 | Temp 98.6°F | Ht 66.25 in | Wt 164.0 lb

## 2015-04-10 DIAGNOSIS — Z0000 Encounter for general adult medical examination without abnormal findings: Secondary | ICD-10-CM

## 2015-04-10 MED ORDER — OMEPRAZOLE 40 MG PO CPDR: 40.0000 mg | DELAYED_RELEASE_CAPSULE | Freq: Every day | ORAL | Status: DC

## 2015-04-10 MED ORDER — LISINOPRIL-HYDROCHLOROTHIAZIDE 10-12.5 MG PO TABS: 0.5000 | ORAL_TABLET | Freq: Every day | ORAL | Status: DC

## 2015-04-10 NOTE — Progress Notes (Signed)
Pre visit review using our clinic review tool, if applicable. No additional management support is needed unless otherwise documented below in the visit note. 

## 2015-04-10 NOTE — Progress Notes (Signed)
   Subjective:    Patient ID: [name], female    DOB: 05-20-1945, 70 y.o.   MRN: [id number]  HPI 70 yr old female for a cpx. She feels well. Her BP has been stable.    Review of Systems  Constitutional: Negative.   HENT: Negative.   Eyes: Negative.   Respiratory: Negative.   Cardiovascular: Negative.   Gastrointestinal: Negative.   Genitourinary: Negative for dysuria, urgency, frequency, hematuria, flank pain, decreased urine volume, enuresis, difficulty urinating, pelvic pain and dyspareunia.  Musculoskeletal: Negative.   Skin: Negative.   Neurological: Negative.   Psychiatric/Behavioral: Negative.        Objective:   Physical Exam  Constitutional: She is oriented to person, place, and time. She appears well-developed and well-nourished. No distress.  HENT:  Head: Normocephalic and atraumatic.  Right Ear: External ear normal.  Left Ear: External ear normal.  Nose: Nose normal.  Mouth/Throat: Oropharynx is clear and moist. No oropharyngeal exudate.  Eyes: Conjunctivae and EOM are normal. Pupils are equal, round, and reactive to light. No scleral icterus.  Neck: Normal range of motion. Neck supple. No JVD present. No thyromegaly present.  Cardiovascular: Normal rate, regular rhythm, normal heart sounds and intact distal pulses.  Exam reveals no gallop and no friction rub.   No murmur heard. EKG normal   Pulmonary/Chest: Effort normal and breath sounds normal. No respiratory distress. She has no wheezes. She has no rales. She exhibits no tenderness.  Abdominal: Soft. Bowel sounds are normal. She exhibits no distension and no mass. There is no tenderness. There is no rebound and no guarding.  Musculoskeletal: Normal range of motion. She exhibits no edema or tenderness.  Lymphadenopathy:    She has no cervical adenopathy.  Neurological: She is alert and oriented to person, place, and time. She has normal reflexes. No cranial nerve deficit. She exhibits normal muscle tone.  Coordination normal.  Skin: Skin is warm and dry. No rash noted. No erythema.  Psychiatric: She has a normal mood and affect. Her behavior is normal. Judgment and thought content normal.          Assessment & Plan:  Well exam. We discussed diet and exercise.

## 2015-04-16 ENCOUNTER — Ambulatory Visit
Admission: RE | Admit: 2015-04-16 | Discharge: 2015-04-16 | Disposition: A | Discharge status: Home / Self Care | Payer: Medicare Other | Source: Ambulatory Visit

## 2015-04-16 DIAGNOSIS — Z1231 Encounter for screening mammogram for malignant neoplasm of breast: Secondary | ICD-10-CM

## 2015-05-04 ENCOUNTER — Other Ambulatory Visit: Payer: Self-pay | Admitting: Family Medicine

## 2015-06-17 ENCOUNTER — Encounter: Payer: Self-pay | Admitting: Family Medicine

## 2015-06-17 ENCOUNTER — Ambulatory Visit (INDEPENDENT_AMBULATORY_CARE_PROVIDER_SITE_OTHER): Payer: Medicare Other | Admitting: Family Medicine

## 2015-06-17 VITALS — BP 129/88 | HR 87 | Temp 98.5°F | Ht 66.25 in | Wt 163.0 lb

## 2015-06-17 DIAGNOSIS — Z0000 Encounter for general adult medical examination without abnormal findings: Secondary | ICD-10-CM

## 2015-06-17 DIAGNOSIS — D179 Benign lipomatous neoplasm, unspecified: Secondary | ICD-10-CM

## 2015-06-17 NOTE — Progress Notes (Signed)
Pre visit review using our clinic review tool, if applicable. No additional management support is needed unless otherwise documented below in the visit note. 

## 2015-06-17 NOTE — Progress Notes (Signed)
   Subjective:    Patient ID: [name], female    DOB: Nov 21, 1945, 70 y.o.   MRN: [id number]  HPI Here to check a lump on the right thigh that she noticed 2 weeks ago. It does not bother her at all. 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    Review of Systems  Constitutional: Negative.        Objective:   Physical Exam  Constitutional: She appears well-developed and well-nourished.  Musculoskeletal:  Right anterior thigh has a firm, mobile, non-tender lump under the skin with a diameter of about 3 cm           Assessment & Plan:  This is another lipoma. We will monitor this only, no treatment is needed.  Laurey Morale, MD

## 2015-06-18 LAB — HEPATITIS C ANTIBODY: HCV AB: NEGATIVE

## 2015-12-27 ENCOUNTER — Ambulatory Visit (INDEPENDENT_AMBULATORY_CARE_PROVIDER_SITE_OTHER): Payer: Medicare Other

## 2015-12-27 DIAGNOSIS — Z23 Encounter for immunization: Secondary | ICD-10-CM | POA: Diagnosis not present

## 2016-03-11 ENCOUNTER — Other Ambulatory Visit: Payer: Self-pay | Admitting: Family Medicine

## 2016-03-11 DIAGNOSIS — Z1231 Encounter for screening mammogram for malignant neoplasm of breast: Secondary | ICD-10-CM

## 2016-04-20 ENCOUNTER — Other Ambulatory Visit (INDEPENDENT_AMBULATORY_CARE_PROVIDER_SITE_OTHER): Payer: Medicare Other

## 2016-04-20 DIAGNOSIS — Z0000 Encounter for general adult medical examination without abnormal findings: Secondary | ICD-10-CM | POA: Diagnosis not present

## 2016-04-20 LAB — LIPID PANEL
CHOLESTEROL: 234 mg/dL — AB (ref 0–200)
HDL: 61 mg/dL (ref >39.00)
LDL Cholesterol: 146 mg/dL — ABNORMAL HIGH (ref 0–99)
NONHDL: 173.05
Total CHOL/HDL Ratio: 4
Triglycerides: 134 mg/dL (ref 0.0–149.0)
VLDL: 26.8 mg/dL (ref 0.0–40.0)

## 2016-04-20 LAB — BASIC METABOLIC PANEL
BUN: 17 mg/dL (ref 6–23)
CHLORIDE: 101 meq/L (ref 96–112)
CO2: 31 meq/L (ref 19–32)
Calcium: 9.2 mg/dL (ref 8.4–10.5)
Creatinine, Ser: 0.67 mg/dL (ref 0.40–1.20)
GFR: 91.75 mL/min (ref >60.00)
GLUCOSE: 90 mg/dL (ref 70–99)
POTASSIUM: 4.4 meq/L (ref 3.5–5.1)
SODIUM: 138 meq/L (ref 135–145)

## 2016-04-20 LAB — CBC WITH DIFFERENTIAL/PLATELET
Basophils Absolute: 0 10*3/uL (ref 0.0–0.1)
Basophils Relative: 0.7 % (ref 0.0–3.0)
EOS PCT: 3.2 % (ref 0.0–5.0)
Eosinophils Absolute: 0.2 10*3/uL (ref 0.0–0.7)
HEMATOCRIT: 38.2 % (ref 36.0–46.0)
Hemoglobin: 13 g/dL (ref 12.0–15.0)
LYMPHS PCT: 27.9 % (ref 12.0–46.0)
Lymphs Abs: 1.6 10*3/uL (ref 0.7–4.0)
MCHC: 33.9 g/dL (ref 30.0–36.0)
MCV: 94 fl (ref 78.0–100.0)
MONOS PCT: 7.4 % (ref 3.0–12.0)
Monocytes Absolute: 0.4 10*3/uL (ref 0.1–1.0)
NEUTROS ABS: 3.6 10*3/uL (ref 1.4–7.7)
Neutrophils Relative %: 60.8 % (ref 43.0–77.0)
PLATELETS: 278 10*3/uL (ref 150.0–400.0)
RBC: 4.07 Mil/uL (ref 3.87–5.11)
RDW: 13 % (ref 11.5–15.5)
WBC: 5.9 10*3/uL (ref 4.0–10.5)

## 2016-04-20 LAB — POC URINALSYSI DIPSTICK (AUTOMATED)
Bilirubin, UA: NEGATIVE
Blood, UA: NEGATIVE
Glucose, UA: NEGATIVE
Ketones, UA: NEGATIVE
Leukocytes, UA: NEGATIVE
NITRITE UA: NEGATIVE
PH UA: 5.5
PROTEIN UA: NEGATIVE
Spec Grav, UA: 1.02
UROBILINOGEN UA: 0.2

## 2016-04-20 LAB — HEPATIC FUNCTION PANEL
ALBUMIN: 4.2 g/dL (ref 3.5–5.2)
ALK PHOS: 101 U/L (ref 39–117)
ALT: 19 U/L (ref 0–35)
AST: 17 U/L (ref 0–37)
Bilirubin, Direct: 0.1 mg/dL (ref 0.0–0.3)
TOTAL PROTEIN: 6.9 g/dL (ref 6.0–8.3)
Total Bilirubin: 0.7 mg/dL (ref 0.2–1.2)

## 2016-04-20 LAB — TSH: TSH: 3.01 u[IU]/mL (ref 0.35–4.50)

## 2016-04-24 ENCOUNTER — Ambulatory Visit
Admission: RE | Admit: 2016-04-24 | Discharge: 2016-04-24 | Disposition: A | Discharge status: Home / Self Care | Payer: Medicare Other | Source: Ambulatory Visit | Attending: Family Medicine | Admitting: Family Medicine

## 2016-04-24 DIAGNOSIS — Z1231 Encounter for screening mammogram for malignant neoplasm of breast: Secondary | ICD-10-CM

## 2016-04-27 ENCOUNTER — Ambulatory Visit (INDEPENDENT_AMBULATORY_CARE_PROVIDER_SITE_OTHER): Payer: Medicare Other | Admitting: Family Medicine

## 2016-04-27 ENCOUNTER — Encounter: Payer: Self-pay | Admitting: Family Medicine

## 2016-04-27 VITALS — BP 107/75 | HR 77 | Temp 98.1°F | Ht 66.25 in | Wt 166.0 lb

## 2016-04-27 DIAGNOSIS — Z0000 Encounter for general adult medical examination without abnormal findings: Secondary | ICD-10-CM | POA: Diagnosis not present

## 2016-04-27 MED ORDER — OMEPRAZOLE 40 MG PO CPDR: 40.0000 mg | DELAYED_RELEASE_CAPSULE | Freq: Every day | ORAL | 11 refills | Status: DC

## 2016-04-27 NOTE — Progress Notes (Signed)
   Subjective:    Patient ID: [name], female    DOB: 06-05-1945, 71 y.o.   MRN: [id number]  HPI 71 yr old female for a well exam. She feels fine but she does mention an occasional dry cough. She has been taking 1/2 tablet of her BP medication for several years and if anything her BP has been a bit low. She the highest readings she ever gets at home are in the [id number] systolic. We have looked at several small lipomas on her body and she now would them to be removed.    Review of Systems  Constitutional: Negative.   HENT: Negative.   Eyes: Negative.   Respiratory: Positive for cough. Negative for apnea, choking, chest tightness, shortness of breath, wheezing and stridor.   Cardiovascular: Negative.   Gastrointestinal: Negative.   Genitourinary: Negative for decreased urine volume, difficulty urinating, dyspareunia, dysuria, enuresis, flank pain, frequency, hematuria, pelvic pain and urgency.  Musculoskeletal: Negative.   Skin: Negative.   Neurological: Negative.   Psychiatric/Behavioral: Negative.        Objective:   Physical Exam  Constitutional: She is oriented to person, place, and time. She appears well-developed and well-nourished. No distress.  HENT:  Head: Normocephalic and atraumatic.  Right Ear: External ear normal.  Left Ear: External ear normal.  Nose: Nose normal.  Mouth/Throat: Oropharynx is clear and moist. No oropharyngeal exudate.  Eyes: Conjunctivae and EOM are normal. Pupils are equal, round, and reactive to light. No scleral icterus.  Neck: Normal range of motion. Neck supple. No JVD present. No thyromegaly present.  Cardiovascular: Normal rate, regular rhythm, normal heart sounds and intact distal pulses.  Exam reveals no gallop and no friction rub.   No murmur heard. Pulmonary/Chest: Effort normal and breath sounds normal. No respiratory distress. She has no wheezes. She has no rales. She exhibits no tenderness.  Abdominal: Soft. Bowel sounds are normal.  She exhibits no distension and no mass. There is no tenderness. There is no rebound and no guarding.  Musculoskeletal: Normal range of motion. She exhibits no edema or tenderness.  Lymphadenopathy:    She has no cervical adenopathy.  Neurological: She is alert and oriented to person, place, and time. She has normal reflexes. No cranial nerve deficit. She exhibits normal muscle tone. Coordination normal.  Skin: Skin is warm and dry. No rash noted. No erythema.  Small mobile non-tender firm masses under the skin on the right forearm, right anterior thigh, and left groin  Psychiatric: She has a normal mood and affect. Her behavior is normal. Judgment and thought content normal.          Assessment & Plan:  Well exam. We discussed diet and exercise. Refer to Surgery to evaluate the lipomas. Her BP is well controlled so we agreed to stop the Lisinopril HCT completely. I believe her cough ,may be a side effect of this as well. She will monitor the BP at home and report back to [place] in 2 weeks.  Alysia Penna, MD

## 2016-04-27 NOTE — Progress Notes (Signed)
Pre visit review using our clinic review tool, if applicable. No additional management support is needed unless otherwise documented below in the visit note. 

## 2016-05-19 ENCOUNTER — Telehealth: Payer: Self-pay | Admitting: Family Medicine

## 2016-05-19 DIAGNOSIS — D179 Benign lipomatous neoplasm, unspecified: Secondary | ICD-10-CM

## 2016-05-19 NOTE — Telephone Encounter (Signed)
I spoke with pt and gave information.

## 2016-05-19 NOTE — Telephone Encounter (Signed)
Pt states she discussed with Dr Sarajane Jews about referring to a surgeon for removal of some fatty tumors. Please advise.

## 2016-05-19 NOTE — Telephone Encounter (Signed)
I did another referral to Surgery

## 2016-11-05 ENCOUNTER — Telehealth: Payer: Self-pay | Admitting: Family Medicine

## 2016-11-05 DIAGNOSIS — H9202 Otalgia, left ear: Secondary | ICD-10-CM

## 2016-11-05 DIAGNOSIS — J3800 Paralysis of vocal cords and larynx, unspecified: Secondary | ICD-10-CM

## 2016-11-05 NOTE — Telephone Encounter (Signed)
I spoke with pt and gave below information.  

## 2016-11-05 NOTE — Telephone Encounter (Signed)
The referral was done  

## 2016-11-05 NOTE — Telephone Encounter (Signed)
Pt need a referral for L ear and voice cord function to Dr. Melissa Montane [facility]

## 2016-12-02 ENCOUNTER — Ambulatory Visit (INDEPENDENT_AMBULATORY_CARE_PROVIDER_SITE_OTHER): Payer: Medicare Other | Admitting: Family Medicine

## 2016-12-02 ENCOUNTER — Encounter: Payer: Self-pay | Admitting: Family Medicine

## 2016-12-02 VITALS — BP 138/88 | Temp 98.5°F | Ht 66.25 in | Wt 163.0 lb

## 2016-12-02 DIAGNOSIS — G4762 Sleep related leg cramps: Secondary | ICD-10-CM | POA: Diagnosis not present

## 2016-12-02 DIAGNOSIS — G629 Polyneuropathy, unspecified: Secondary | ICD-10-CM

## 2016-12-02 DIAGNOSIS — Z23 Encounter for immunization: Secondary | ICD-10-CM | POA: Diagnosis not present

## 2016-12-02 NOTE — Progress Notes (Signed)
   Subjective:    Patient ID: [name], female    DOB: 01-21-1946, 71 y.o.   MRN: [id number]  HPI Here for several concerns. First about 6 weeks ago she developed an area of burning and itching on the left upper back. She never saw a rash. It seems to be slowly improving. Also she has had some nocturnal leg cramps in both calves. No swelling.    Review of Systems  Constitutional: Negative.   Respiratory: Negative.   Cardiovascular: Negative.   Musculoskeletal: Positive for back pain and myalgias.  Skin: Negative for rash.       Objective:   Physical Exam  Constitutional: She is oriented to person, place, and time. She appears well-developed and well-nourished.  Cardiovascular: Normal rate, regular rhythm, normal heart sounds and intact distal pulses.   Pulmonary/Chest: Effort normal and breath sounds normal. No respiratory distress. She has no wheezes. She has no rales.  Musculoskeletal:  Both calves are normal, no swelling or tenderness   Neurological: She is alert and oriented to person, place, and time.  Skin:  The upper left back has an area of dry excoriated skin but no vesicles or rash is seen           Assessment & Plan:  Seems to have an irritated nerve under the skin on the back. No evidence of shingles. I offered to let her try some Gabapentin but she said she would rather give it more time to go away on its own. For the leg cramps I suggested she take 1 or 2 tablets every night of magnesium.  Alysia Penna, MD

## 2016-12-02 NOTE — Addendum Note (Signed)
Addended by: Aggie Hacker A on: 12/02/2016 01:53 PM   Modules accepted: Orders

## 2016-12-02 NOTE — Patient Instructions (Signed)
WE NOW OFFER   [facility] FAST TRACK!!!  SAME DAY Appointments for ACUTE CARE  Such as: Sprains, Injuries, cuts, abrasions, rashes, muscle pain, joint pain, back pain Colds, flu, sore throats, headache, allergies, cough, fever  Ear pain, sinus and eye infections Abdominal pain, nausea, vomiting, diarrhea, upset stomach Animal/insect bites  3 Easy Ways to Schedule: Walk-In Scheduling Call in scheduling Mychart Sign-up: [email or web address]         

## 2017-03-15 ENCOUNTER — Other Ambulatory Visit: Payer: Self-pay | Admitting: Family Medicine

## 2017-03-15 DIAGNOSIS — Z1231 Encounter for screening mammogram for malignant neoplasm of breast: Secondary | ICD-10-CM

## 2017-04-06 ENCOUNTER — Encounter: Payer: Medicare Other | Admitting: Family Medicine

## 2017-04-21 ENCOUNTER — Other Ambulatory Visit: Payer: Medicare Other

## 2017-04-26 ENCOUNTER — Ambulatory Visit
Admission: RE | Admit: 2017-04-26 | Discharge: 2017-04-26 | Disposition: A | Discharge status: Home / Self Care | Payer: Medicare Other | Source: Ambulatory Visit | Attending: Family Medicine | Admitting: Family Medicine

## 2017-04-26 DIAGNOSIS — Z1231 Encounter for screening mammogram for malignant neoplasm of breast: Secondary | ICD-10-CM | POA: Diagnosis not present

## 2017-05-04 ENCOUNTER — Encounter: Payer: Self-pay | Admitting: Family Medicine

## 2017-05-04 ENCOUNTER — Ambulatory Visit (INDEPENDENT_AMBULATORY_CARE_PROVIDER_SITE_OTHER): Payer: PPO | Admitting: Family Medicine

## 2017-05-04 VITALS — BP 130/80 | HR 79 | Temp 97.9°F | Ht 65.75 in | Wt 164.2 lb

## 2017-05-04 DIAGNOSIS — Z23 Encounter for immunization: Secondary | ICD-10-CM | POA: Diagnosis not present

## 2017-05-04 DIAGNOSIS — E785 Hyperlipidemia, unspecified: Secondary | ICD-10-CM

## 2017-05-04 DIAGNOSIS — I10 Essential (primary) hypertension: Secondary | ICD-10-CM

## 2017-05-04 DIAGNOSIS — Z0000 Encounter for general adult medical examination without abnormal findings: Secondary | ICD-10-CM | POA: Diagnosis not present

## 2017-05-04 DIAGNOSIS — G8929 Other chronic pain: Secondary | ICD-10-CM

## 2017-05-04 DIAGNOSIS — M25562 Pain in left knee: Secondary | ICD-10-CM

## 2017-05-04 DIAGNOSIS — M7660 Achilles tendinitis, unspecified leg: Secondary | ICD-10-CM | POA: Diagnosis not present

## 2017-05-04 DIAGNOSIS — K219 Gastro-esophageal reflux disease without esophagitis: Secondary | ICD-10-CM

## 2017-05-04 LAB — BASIC METABOLIC PANEL
BUN: 18 mg/dL (ref 6–23)
CALCIUM: 9.7 mg/dL (ref 8.4–10.5)
CO2: 31 mEq/L (ref 19–32)
CREATININE: 0.74 mg/dL (ref 0.40–1.20)
Chloride: 101 mEq/L (ref 96–112)
GFR: 81.57 mL/min (ref >60.00)
GLUCOSE: 96 mg/dL (ref 70–99)
Potassium: 4.4 mEq/L (ref 3.5–5.1)
SODIUM: 138 meq/L (ref 135–145)

## 2017-05-04 LAB — POC URINALSYSI DIPSTICK (AUTOMATED)
Glucose, UA: NEGATIVE
Ketones, UA: NEGATIVE
Leukocytes, UA: NEGATIVE
NITRITE UA: NEGATIVE
PH UA: 7.5 (ref 5.0–8.0)
PROTEIN UA: NEGATIVE
RBC UA: NEGATIVE
SPEC GRAV UA: 1.025 (ref 1.010–1.025)
UROBILINOGEN UA: 0.2 U/dL

## 2017-05-04 LAB — TSH: TSH: 3.33 u[IU]/mL (ref 0.35–4.50)

## 2017-05-04 LAB — CBC WITH DIFFERENTIAL/PLATELET
BASOS ABS: 0.1 10*3/uL (ref 0.0–0.1)
Basophils Relative: 1 % (ref 0.0–3.0)
EOS ABS: 0.2 10*3/uL (ref 0.0–0.7)
Eosinophils Relative: 3.4 % (ref 0.0–5.0)
HEMATOCRIT: 40.1 % (ref 36.0–46.0)
Hemoglobin: 13.5 g/dL (ref 12.0–15.0)
LYMPHS PCT: 25.7 % (ref 12.0–46.0)
Lymphs Abs: 1.7 10*3/uL (ref 0.7–4.0)
MCHC: 33.8 g/dL (ref 30.0–36.0)
MCV: 93.2 fl (ref 78.0–100.0)
MONO ABS: 0.5 10*3/uL (ref 0.1–1.0)
Monocytes Relative: 7.6 % (ref 3.0–12.0)
Neutro Abs: 4.1 10*3/uL (ref 1.4–7.7)
Neutrophils Relative %: 62.3 % (ref 43.0–77.0)
Platelets: 265 10*3/uL (ref 150.0–400.0)
RBC: 4.3 Mil/uL (ref 3.87–5.11)
RDW: 12.9 % (ref 11.5–15.5)
WBC: 6.5 10*3/uL (ref 4.0–10.5)

## 2017-05-04 LAB — LIPID PANEL
Cholesterol: 239 mg/dL — ABNORMAL HIGH (ref 0–200)
HDL: 68.6 mg/dL (ref >39.00)
LDL CALC: 143 mg/dL — AB (ref 0–99)
NONHDL: 170.19
Total CHOL/HDL Ratio: 3
Triglycerides: 138 mg/dL (ref 0.0–149.0)
VLDL: 27.6 mg/dL (ref 0.0–40.0)

## 2017-05-04 LAB — HEPATIC FUNCTION PANEL
ALBUMIN: 4.2 g/dL (ref 3.5–5.2)
ALT: 14 U/L (ref 0–35)
AST: 15 U/L (ref 0–37)
Alkaline Phosphatase: 113 U/L (ref 39–117)
BILIRUBIN DIRECT: 0.1 mg/dL (ref 0.0–0.3)
TOTAL PROTEIN: 7.3 g/dL (ref 6.0–8.3)
Total Bilirubin: 0.6 mg/dL (ref 0.2–1.2)

## 2017-05-04 MED ORDER — OMEPRAZOLE 40 MG PO CPDR: 40.0000 mg | DELAYED_RELEASE_CAPSULE | Freq: Every day | ORAL | 3 refills | Status: DC

## 2017-05-04 NOTE — Progress Notes (Signed)
   Subjective:    Patient ID: [name], female    DOB: 20-Sep-1945, 72 y.o.   MRN: [id number]  HPI Here to follow up issues. Her BP has been stable. Her GERD is well controlled, however she does complain of increased gassiness in the abdomen lately. She still has left knee pain and right Achilles pain. She had a normal mammogram last week.    Review of Systems  Constitutional: Negative.   HENT: Negative.   Eyes: Negative.   Respiratory: Negative.   Cardiovascular: Negative.   Gastrointestinal: Negative.   Genitourinary: Negative for decreased urine volume, difficulty urinating, dyspareunia, dysuria, enuresis, flank pain, frequency, hematuria, pelvic pain and urgency.  Musculoskeletal: Positive for arthralgias. Negative for back pain, gait problem, joint swelling, myalgias, neck pain and neck stiffness.  Skin: Negative.   Neurological: Negative.   Psychiatric/Behavioral: Negative.        Objective:   Physical Exam  Constitutional: She is oriented to person, place, and time. She appears well-developed and well-nourished. No distress.  HENT:  Head: Normocephalic and atraumatic.  Right Ear: External ear normal.  Left Ear: External ear normal.  Nose: Nose normal.  Mouth/Throat: Oropharynx is clear and moist. No oropharyngeal exudate.  Eyes: Conjunctivae and EOM are normal. Pupils are equal, round, and reactive to light. No scleral icterus.  Neck: Normal range of motion. Neck supple. No JVD present. No thyromegaly present.  Cardiovascular: Normal rate, regular rhythm, normal heart sounds and intact distal pulses. Exam reveals no gallop and no friction rub.  No murmur heard. Pulmonary/Chest: Effort normal and breath sounds normal. No respiratory distress. She has no wheezes. She has no rales. She exhibits no tenderness.  Abdominal: Soft. Bowel sounds are normal. She exhibits no distension and no mass. There is no tenderness. There is no rebound and no guarding.  Musculoskeletal:  Normal range of motion. She exhibits no edema or tenderness.  Lymphadenopathy:    She has no cervical adenopathy.  Neurological: She is alert and oriented to person, place, and time. She has normal reflexes. No cranial nerve deficit. She exhibits normal muscle tone. Coordination normal.  Skin: Skin is warm and dry. No rash noted. No erythema.  Psychiatric: She has a normal mood and affect. Her behavior is normal. Judgment and thought content normal.          Assessment & Plan:  Her HTN and GERD are stable. For the intestinal gas she will try a probiotic such as Align daily. For the Achilles pain and knee pain, we will refer her to Orthopedics. Refer for another colonoscopy.  Alysia Penna, MD

## 2017-05-10 ENCOUNTER — Telehealth: Payer: Self-pay | Admitting: Family Medicine

## 2017-05-10 NOTE — Telephone Encounter (Signed)
Copied from [place] [id number]. Topic: Quick Communication - Lab Results >> May 10, 2017 11:47 AM Burnis Medin, NT wrote: Patient called and wanted to see if her lab results were back. Pt would like a call back

## 2017-05-10 NOTE — Telephone Encounter (Signed)
Pt advised of her results and requested to have her results mailed. Results placed to be mailed to pt.

## 2017-05-20 DIAGNOSIS — M25562 Pain in left knee: Secondary | ICD-10-CM | POA: Diagnosis not present

## 2017-05-20 DIAGNOSIS — M7661 Achilles tendinitis, right leg: Secondary | ICD-10-CM | POA: Diagnosis not present

## 2017-06-04 ENCOUNTER — Encounter: Payer: Self-pay | Admitting: Family Medicine

## 2017-06-15 ENCOUNTER — Ambulatory Visit: Payer: PPO | Admitting: Physical Therapy

## 2017-07-01 ENCOUNTER — Ambulatory Visit: Payer: PPO | Admitting: Physical Therapy

## 2017-07-07 ENCOUNTER — Encounter: Payer: Self-pay | Admitting: Physical Therapy

## 2017-07-07 ENCOUNTER — Ambulatory Visit: Payer: PPO | Attending: Orthopedic Surgery | Admitting: Physical Therapy

## 2017-07-07 DIAGNOSIS — M25571 Pain in right ankle and joints of right foot: Secondary | ICD-10-CM | POA: Insufficient documentation

## 2017-07-07 DIAGNOSIS — M6281 Muscle weakness (generalized): Secondary | ICD-10-CM | POA: Insufficient documentation

## 2017-07-07 NOTE — Therapy (Signed)
[place] [place] [address] [place], [place], [address] Phone: [phone]   Fax:  [phone]  Physical Therapy Evaluation  Patient Details  Name: [name] MRN: [id number] [name] of Birth: 29-Sep-1945 Referring Provider: Frederik Pear, MD   Encounter [name]: 07/07/2017  PT End of Session - 07/07/17 1200    Visit Number  1    Number of Visits  13    PT Start Time  7322    PT Stop Time  0254    PT Time Calculation (min)  46 min    Activity Tolerance  Patient tolerated treatment well    Behavior During Therapy  [facility] for tasks assessed/performed       Past Medical History:  Diagnosis [name]  . GERD (gastroesophageal reflux disease)   . Gynecological examination    sees Dr. Kennith Gain in [place] for GYN exams  . Hyperlipidemia   . Hypertension     Past Surgical History:  Procedure Laterality [name]  . ABDOMINAL HYSTERECTOMY    . COLONOSCOPY  Oct. 2008   diverticulae only, repeat in 10 yrs   . FACELIFT    . TONSILLECTOMY      There were no vitals filed for this visit.   Subjective Assessment - 07/07/17 1156    Subjective  Pt arriving to therpay reporting pain in R achilles tendon that has been doing on for years, but this flare up has been going on since last summer/fall. Pt reporting 5/10 pain today. Pt currently amb wtih a straight cane.     Limitations  Walking    How long can you stand comfortably?  10 minutes    How long can you walk comfortably?  15 minutes    Currently in Pain?  Yes    Pain Score  5     Pain Location  Heel    Pain Orientation  Right    Pain Descriptors / Indicators  Aching    Pain Type  Chronic pain    Pain Onset  More than a month ago    Aggravating Factors   walking, bending ankle    Pain Relieving Factors  ice    Effect of Pain on Daily Activities  limited with ADL's and household chores/ communtiy activities         [facility] PT Assessment - 07/07/17 0001      Assessment    Medical Diagnosis  R achilles tendonitis    Referring Provider  Frederik Pear, MD    Hand Dominance  Right    Next MD Visit  follow up following therapy    Prior Therapy  no      Precautions   Precautions  None      Restrictions   Weight Bearing Restrictions  No      Balance Screen   Has the patient fallen in the past 6 months  No    Is the patient reluctant to leave their home because of a fear of falling?   No      Home Environment   Living Environment  Private residence    Type of Home  Apartment    Home Access  Stairs to enter      Prior Function   Level of Independence  Independent      Cognition   Overall Cognitive Status  Within Functional Limits for tasks assessed      Observation/Other Assessments   Focus on Therapeutic Outcomes (FOTO)   44%  limitation      ROM / Strength   AROM / PROM / Strength  AROM;Strength      AROM   AROM Assessment Site  Ankle    Right/Left Ankle  Right    Right Ankle Dorsiflexion  2    Right Ankle Plantar Flexion  22    Right Ankle Inversion  20    Right Ankle Eversion  14      Strength   Strength Assessment Site  Ankle    Right/Left Ankle  Right    Right Ankle Dorsiflexion  3-/5    Right Ankle Plantar Flexion  4/5    Right Ankle Inversion  4/5    Right Ankle Eversion  4/5      Palpation   Palpation comment  Pt with tenderness over R achilles tendon with swelling noted      Ambulation/Gait   Gait Comments  pt amb with no device but reports she uses a straight cane for longer distances. Pt with mild antalgic gait with decreased heel strike on R LE and decresaed bilateral foot clearence                Objective measurements completed on examination: See above findings.      [facility] Adult PT Treatment/Exercise - 07/07/17 0001      Modalities   Modalities  Iontophoresis      Iontophoresis   Type of Iontophoresis  Dexamethasone    Location  R achilles tendon    Dose  1 ml    Time  4 hour Stat Patch              PT Education - 07/07/17 1224    Education provided  Yes    Education Details  HEP and Ionto patch    Person(s) Educated  Patient    Methods  Explanation;Handout;Verbal cues;Tactile cues    Comprehension  Verbalized understanding;Returned demonstration       PT Short Term Goals - 07/07/17 1356      PT SHORT TERM GOAL #1   Title  Pt will be independent in her HEP.     Time  3    Period  Weeks    Status  New    Target [name]  07/28/17        PT Long Term Goals - 07/07/17 1356      PT LONG TERM GOAL #1   Title  Pt will improve her FOTO score from 44% limitation to </= 36% limitation.     Time  6    Period  Weeks    Status  New    Target [name]  08/18/17      PT LONG TERM GOAL #2   Title  Pt will be able to amb community level distances with no pain reported for >/= 20 minutes.     Time  6    Period  Weeks    Status  New    Target [name]  08/18/17      PT LONG TERM GOAL #3   Title  Pt will improve her R DF to >/= 15 degrees in order to improve heel strike and gait pattern.     Time  6    Period  Months    Status  New    Target [name]  08/18/17             Plan - 07/07/17 1401    Clinical Impression Statement  Pt arriving to therapy today  for evaluation of R achilles tendonitis. Pt reporting pain of 5/10 at rest and increased pain with walking and flexion. Pt with tenderness noted over Achilles Tendon and calf muscle belly. Pt with limited ROM in DF and decreased strength in her R ankle. Ionto Stat Patch placed today and pt was instructed in wearing for 4 hours and removal. Skilled PT needed to progress pt toward her PLOF.     Clinical Presentation  Stable    Clinical Decision Making  Low    Rehab Potential  Good    PT Frequency  2x / week    PT Duration  6 weeks    PT Treatment/Interventions  Cryotherapy;Electrical Stimulation;Gait training;Ultrasound;Moist Heat;Iontophoresis 4mg /ml Dexamethasone;Stair training;Functional mobility training;Therapeutic  activities;Therapeutic exercise;Balance training;Manual techniques;Patient/family education;Passive range of motion;Taping    PT Next Visit Plan  Access Ionto tolerance, DF stretches and ankle exercises    PT Home Exercise Plan  standing toe raises, supine/seated achilles stretch    Consulted and Agree with Plan of Care  Patient       Patient will benefit from skilled therapeutic intervention in order to improve the following deficits and impairments:  Difficulty walking, Pain, Decreased balance, Decreased strength, Decreased mobility, Decreased activity tolerance  Visit Diagnosis: Pain in right ankle and joints of right foot  Muscle weakness (generalized)     Problem List Patient Active Problem List   Diagnosis [name] Noted  . SKIN LESION 03/18/2010  . Dyslipidemia 02/18/2010  . NEVI, MULTIPLE 06/20/2007  . PLANTAR FASCIITIS 05/13/2007  . Essential hypertension 10/11/2006  . GERD 10/11/2006    Oretha Caprice, MPT 07/07/2017, 2:06 PM  [place] [place] [address] [place], [place], [address] Phone: [phone]   Fax:  [phone]  Name: [name] MRN: [id number] [name] of Birth: 21-Oct-1945

## 2017-07-14 ENCOUNTER — Ambulatory Visit: Payer: PPO | Admitting: Physical Therapy

## 2017-07-14 ENCOUNTER — Encounter: Payer: Self-pay | Admitting: Physical Therapy

## 2017-07-14 DIAGNOSIS — M6281 Muscle weakness (generalized): Secondary | ICD-10-CM

## 2017-07-14 DIAGNOSIS — M25571 Pain in right ankle and joints of right foot: Secondary | ICD-10-CM

## 2017-07-14 NOTE — Therapy (Signed)
[name], [place], [address] Phone: [phone]   Fax:  [phone]  Physical Therapy Treatment  Patient Details  Name: [name] MRN: [id number] Date of Birth: 07-22-1945 Referring Provider: Frederik Pear, MD   Encounter Date: 07/14/2017  PT End of Session - 07/14/17 1209    Visit Number  2    Number of Visits  13    PT Start Time  6962    PT Stop Time  1210    PT Time Calculation (min)  42 min    Activity Tolerance  Patient tolerated treatment well    Behavior During Therapy  [facility] for tasks assessed/performed       Past Medical History:  Diagnosis Date  . GERD (gastroesophageal reflux disease)   . Gynecological examination    sees Dr. Kennith Gain in [place] for GYN exams  . Hyperlipidemia   . Hypertension     Past Surgical History:  Procedure Laterality Date  . ABDOMINAL HYSTERECTOMY    . COLONOSCOPY  Oct. 2008   diverticulae only, repeat in 10 yrs   . FACELIFT    . TONSILLECTOMY      There were no vitals filed for this visit.  Subjective Assessment - 07/14/17 1131    Subjective  pt reports that she is doing ok but she always have some pain, Pt reports a fall last Thursday, tripping over rug. No injuries reported did not seek medical attention    Currently in Pain?  Yes    Pain Score  5     Pain Location  Heel    Pain Orientation  Right    Pain Descriptors / Indicators  Aching                       [facility] Adult PT Treatment/Exercise - 07/14/17 0001      Exercises   Exercises  Ankle      Modalities   Modalities  Iontophoresis      Iontophoresis   Type of Iontophoresis  Dexamethasone    Location  R achilles tendon    Dose  1 ml    Time  4 hour Stat Patch      Manual Therapy   Manual Therapy  Passive ROM    Passive ROM  R ankle all directions.      Ankle Exercises: Aerobic   Nustep  L3 x 36min       Ankle Exercises: Standing   Toe Raise  20  reps;2 seconds on airex    Other Standing Ankle Exercises  hee raises on airex 2x10      Ankle Exercises: Seated   ABC's  1 rep    Ankle Circles/Pumps  15 reps    Other Seated Ankle Exercises  4 way ankle x15 each                PT Short Term Goals - 07/07/17 1356      PT SHORT TERM GOAL #1   Title  Pt will be independent in her HEP.     Time  3    Period  Weeks    Status  New    Target Date  07/28/17        PT Long Term Goals - 07/07/17 1356      PT LONG TERM GOAL #1   Title  Pt will improve her FOTO score from 44% limitation to </= 36%  limitation.     Time  6    Period  Weeks    Status  New    Target Date  08/18/17      PT LONG TERM GOAL #2   Title  Pt will be able to amb community level distances with no pain reported for >/= 20 minutes.     Time  6    Period  Weeks    Status  New    Target Date  08/18/17      PT LONG TERM GOAL #3   Title  Pt will improve her R DF to >/= 15 degrees in order to improve heel strike and gait pattern.     Time  6    Period  Months    Status  New    Target Date  08/18/17            Plan - 07/14/17 1210    Clinical Impression Statement  Pt ankle moved freely noted with PROM. Their is a noticeable knot/lump on her R achillis. Pain reported on her lateral shin with resisted eversion. Cues to only use back of chair for balance with heel and toe raises. Less pain reported overall.     Rehab Potential  Good    PT Frequency  2x / week    PT Duration  6 weeks    PT Treatment/Interventions  Cryotherapy;Electrical Stimulation;Gait training;Ultrasound;Moist Heat;Iontophoresis 4mg /ml Dexamethasone;Stair training;Functional mobility training;Therapeutic activities;Therapeutic exercise;Balance training;Manual techniques;Patient/family education;Passive range of motion;Taping    PT Next Visit Plan  Access Ionto tolerance, DF stretches and ankle exercises       Patient will benefit from skilled therapeutic intervention in order to  improve the following deficits and impairments:  Difficulty walking, Pain, Decreased balance, Decreased strength, Decreased mobility, Decreased activity tolerance  Visit Diagnosis: Pain in right ankle and joints of right foot  Muscle weakness (generalized)     Problem List Patient Active Problem List   Diagnosis Date Noted  . SKIN LESION 03/18/2010  . Dyslipidemia 02/18/2010  . NEVI, MULTIPLE 06/20/2007  . PLANTAR FASCIITIS 05/13/2007  . Essential hypertension 10/11/2006  . GERD 10/11/2006    Scot Jun,  PTA 07/14/2017, 12:13 PM  [place], [place], [address] Phone: [phone]   Fax:  [phone]  Name: [name] MRN: [id number] Date of Birth: Aug 08, 1945

## 2017-07-20 ENCOUNTER — Encounter: Payer: Self-pay | Admitting: Physical Therapy

## 2017-07-20 ENCOUNTER — Ambulatory Visit: Payer: PPO | Admitting: Physical Therapy

## 2017-07-20 DIAGNOSIS — M25571 Pain in right ankle and joints of right foot: Secondary | ICD-10-CM | POA: Diagnosis not present

## 2017-07-20 DIAGNOSIS — M6281 Muscle weakness (generalized): Secondary | ICD-10-CM

## 2017-07-20 NOTE — Therapy (Signed)
[place], [place], [address] Phone: [phone]   Fax:  [phone]  Physical Therapy Treatment  Patient Details  Name: [name] MRN: [id number] Date of Birth: 1945/03/30 Referring Provider: Frederik Pear, MD   Encounter Date: 07/20/2017  PT End of Session - 07/20/17 1204    Visit Number  3    Number of Visits  13    PT Start Time  1130    PT Stop Time  1210    PT Time Calculation (min)  40 min       Past Medical History:  Diagnosis Date  . GERD (gastroesophageal reflux disease)   . Gynecological examination    sees Dr. Kennith Gain in [place] for GYN exams  . Hyperlipidemia   . Hypertension     Past Surgical History:  Procedure Laterality Date  . ABDOMINAL HYSTERECTOMY    . COLONOSCOPY  Oct. 2008   diverticulae only, repeat in 10 yrs   . FACELIFT    . TONSILLECTOMY      There were no vitals filed for this visit.  Subjective Assessment - 07/20/17 1130    Subjective  "okay", does not hurt all the time. patch might of helped    Currently in Pain?  Yes    Pain Score  3     Pain Location  Heel    Pain Orientation  Right                       [facility] Adult PT Treatment/Exercise - 07/20/17 0001      Modalities   Modalities  Iontophoresis      Iontophoresis   Type of Iontophoresis  Dexamethasone    Location  R achilles tendon    Dose  1.2 cc    Time  4 hour Stat Patch      Manual Therapy   Manual Therapy  Passive ROM;Soft tissue mobilization    Soft tissue mobilization  achilles tendon and into calf    Passive ROM  R ankle all directions.      Ankle Exercises: Aerobic   Nustep  L3 x 59mn  LE only      Ankle Exercises: Standing   Heel Raises  15 reps black bar    Toe Raise  15 reps black bar    Other Standing Ankle Exercises  RT LE sit fit 4 way 15 each WBAT    Other Standing Ankle Exercises  heel and toe  raises on airex 15 marching 20 times      Ankle Exercises: Seated   Other Seated Ankle Exercises  4 way ankle x15 each  red tband               PT Short Term Goals - 07/20/17 1204      PT SHORT TERM GOAL #1   Title  Pt will be independent in her HEP.     Status  Achieved        PT Long Term Goals - 07/07/17 1356      PT LONG TERM GOAL #1   Title  Pt will improve her FOTO score from 44% limitation to </= 36% limitation.     Time  6    Period  Weeks    Status  New    Target Date  08/18/17      PT LONG TERM GOAL #2   Title  Pt will be able  to amb community level distances with no pain reported for >/= 20 minutes.     Time  6    Period  Weeks    Status  New    Target Date  08/18/17      PT LONG TERM GOAL #3   Title  Pt will improve her R DF to >/= 15 degrees in order to improve heel strike and gait pattern.     Time  6    Period  Months    Status  New    Target Date  08/18/17            Plan - 07/20/17 1204    Clinical Impression Statement  STG met. pt feels "knot " in achilles is smaller. Excellent ROM and no c/o pain in ankel with today interventions. STG met    PT Treatment/Interventions  Cryotherapy;Electrical Stimulation;Gait training;Ultrasound;Moist Heat;Iontophoresis '4mg'$ /ml Dexamethasone;Stair training;Functional mobility training;Therapeutic activities;Therapeutic exercise;Balance training;Manual techniques;Patient/family education;Passive range of motion;Taping    PT Next Visit Plan  progress and check AROM       Patient will benefit from skilled therapeutic intervention in order to improve the following deficits and impairments:  Difficulty walking, Pain, Decreased balance, Decreased strength, Decreased mobility, Decreased activity tolerance  Visit Diagnosis: Pain in right ankle and joints of right foot  Muscle weakness (generalized)     Problem List Patient Active Problem List   Diagnosis Date Noted  . SKIN LESION 03/18/2010  . Dyslipidemia 02/18/2010  . NEVI, MULTIPLE  06/20/2007  . PLANTAR FASCIITIS 05/13/2007  . Essential hypertension 10/11/2006  . GERD 10/11/2006    PAYSEUR,ANGIE PTA 07/20/2017, 12:06 PM  [place] [address] [place], [place], [address] Phone: [phone]   Fax:  [phone]  Name: DORRI OZTURK MRN: [id number] Date of Birth: 1946/01/25

## 2017-07-27 ENCOUNTER — Encounter: Payer: Self-pay | Admitting: Physical Therapy

## 2017-07-27 ENCOUNTER — Ambulatory Visit: Payer: PPO | Admitting: Physical Therapy

## 2017-07-27 DIAGNOSIS — M25571 Pain in right ankle and joints of right foot: Secondary | ICD-10-CM

## 2017-07-27 DIAGNOSIS — M6281 Muscle weakness (generalized): Secondary | ICD-10-CM

## 2017-07-27 NOTE — Therapy (Signed)
[place], [place], [address] Phone: [phone]   Fax:  [phone]  Physical Therapy Treatment  Patient Details  Name: [name] MRN: [id number] Date of Birth: Jan 06, 1946 Referring Provider: Frederik Pear, MD   Encounter Date: 07/27/2017  PT End of Session - 07/27/17 1222    Visit Number  4    PT Start Time  9024    PT Stop Time  1225    PT Time Calculation (min)  40 min    Activity Tolerance  Patient tolerated treatment well    Behavior During Therapy  [facility] for tasks assessed/performed       Past Medical History:  Diagnosis Date  . GERD (gastroesophageal reflux disease)   . Gynecological examination    sees Dr. Kennith Gain in [place] for GYN exams  . Hyperlipidemia   . Hypertension     Past Surgical History:  Procedure Laterality Date  . ABDOMINAL HYSTERECTOMY    . COLONOSCOPY  Oct. 2008   diverticulae only, repeat in 10 yrs   . FACELIFT    . TONSILLECTOMY      There were no vitals filed for this visit.  Subjective Assessment - 07/27/17 1148    Subjective  Pt reports that's he ankle isn't hurting as much as it use too. "It is not as lumpy."     Currently in Pain?  No/denies    Pain Score  0-No pain                       [facility] Adult PT Treatment/Exercise - 07/27/17 0001      Ambulation/Gait   Stairs  Yes    Stairs Assistance  5: Supervision    Stair Management Technique  One rail Right;One rail Left;Alternating pattern    Number of Stairs  24    Height of Stairs  6    Gait Comments  cues for alternating patterm      Modalities   Modalities  Iontophoresis      Iontophoresis   Type of Iontophoresis  Dexamethasone    Location  R achilles tendon    Dose  1.2 cc    Time  4 hour Stat Patch      Manual Therapy   Manual Therapy  Passive ROM;Soft tissue mobilization    Soft tissue mobilization  achilles tendon and into calf    Passive ROM  R ankle all  directions.      Ankle Exercises: Aerobic   Nustep  L4 x 64min       Ankle Exercises: Standing   Heel Raises  15 reps black bar    Toe Raise  15 reps black bar    Other Standing Ankle Exercises  RT LE sit fit 4 way 15 each WBAT    Other Standing Ankle Exercises  heel and toe  raises on airex 15 airex       Ankle Exercises: Seated   Other Seated Ankle Exercises  4 way ankle x15 each                PT Short Term Goals - 07/20/17 1204      PT SHORT TERM GOAL #1   Title  Pt will be independent in her HEP.     Status  Achieved        PT Long Term Goals - 07/07/17 1356      PT LONG TERM GOAL #1  Title  Pt will improve her FOTO score from 44% limitation to </= 36% limitation.     Time  6    Period  Weeks    Status  New    Target Date  08/18/17      PT LONG TERM GOAL #2   Title  Pt will be able to amb community level distances with no pain reported for >/= 20 minutes.     Time  6    Period  Weeks    Status  New    Target Date  08/18/17      PT LONG TERM GOAL #3   Title  Pt will improve her R DF to >/= 15 degrees in order to improve heel strike and gait pattern.     Time  6    Period  Months    Status  New    Target Date  08/18/17            Plan - 07/27/17 1223    Clinical Impression Statement  Pt reports less pain overall, and a "Smaller lump". ROM remains good with good strength with today's activities. Cues to nor let body lean with heel and toe raises.    Rehab Potential  Good    PT Frequency  2x / week    PT Duration  6 weeks    PT Treatment/Interventions  Cryotherapy;Electrical Stimulation;Gait training;Ultrasound;Moist Heat;Iontophoresis 4mg /ml Dexamethasone;Stair training;Functional mobility training;Therapeutic activities;Therapeutic exercise;Balance training;Manual techniques;Patient/family education;Passive range of motion;Taping    PT Next Visit Plan  progress and check AROM       Patient will benefit from skilled therapeutic intervention  in order to improve the following deficits and impairments:  Difficulty walking, Pain, Decreased balance, Decreased strength, Decreased mobility, Decreased activity tolerance  Visit Diagnosis: Pain in right ankle and joints of right foot  Muscle weakness (generalized)     Problem List Patient Active Problem List   Diagnosis Date Noted  . SKIN LESION 03/18/2010  . Dyslipidemia 02/18/2010  . NEVI, MULTIPLE 06/20/2007  . PLANTAR FASCIITIS 05/13/2007  . Essential hypertension 10/11/2006  . GERD 10/11/2006    Scot Jun, PTA 07/27/2017, 12:26 PM  [place] [address] [place], [place], [address] Phone: [phone]   Fax:  [phone]  Name: SHANITA KANAN MRN: [id number] Date of Birth: [birth year]

## 2017-08-03 ENCOUNTER — Encounter: Payer: Self-pay | Admitting: Physical Therapy

## 2017-08-03 ENCOUNTER — Ambulatory Visit: Payer: PPO | Attending: Orthopedic Surgery | Admitting: Physical Therapy

## 2017-08-03 DIAGNOSIS — M25571 Pain in right ankle and joints of right foot: Secondary | ICD-10-CM | POA: Diagnosis not present

## 2017-08-03 DIAGNOSIS — M6281 Muscle weakness (generalized): Secondary | ICD-10-CM

## 2017-08-03 NOTE — Therapy (Signed)
[place], [place], [address] Phone: [phone]   Fax:  [phone]  Physical Therapy Treatment  Patient Details  Name: [name] MRN: [id number] Date of Birth: April 03, 1945 Referring Provider: Frederik Pear, MD   Encounter Date: 08/03/2017  PT End of Session - 08/03/17 1218    Visit Number  5    Number of Visits  13    PT Start Time  3790    PT Stop Time  1218    PT Time Calculation (min)  43 min       Past Medical History:  Diagnosis Date  . GERD (gastroesophageal reflux disease)   . Gynecological examination    sees Dr. Kennith Gain in [place] for GYN exams  . Hyperlipidemia   . Hypertension     Past Surgical History:  Procedure Laterality Date  . ABDOMINAL HYSTERECTOMY    . COLONOSCOPY  Oct. 2008   diverticulae only, repeat in 10 yrs   . FACELIFT    . TONSILLECTOMY      There were no vitals filed for this visit.  Subjective Assessment - 08/03/17 1140    Subjective  "It is pretty much ok" Pt reports the medial and later pain is gon, she still has a bump on the back of her achillis that causes some pain.    Currently in Pain?  Yes    Pain Score  0-No pain    Pain Location  Ankle    Pain Orientation  Right    Pain Descriptors / Indicators  Discomfort         [facility] PT Assessment - 08/03/17 0001      AROM   AROM Assessment Site  Ankle    Right/Left Ankle  Right    Right Ankle Dorsiflexion  16                   [facility] Adult PT Treatment/Exercise - 08/03/17 0001      Ambulation/Gait   Gait Comments  one flight of stairs then back bown, ambulatind up hill an d around front island. Pt reprots one bout of shooting pain.       Modalities   Modalities  Iontophoresis      Iontophoresis   Type of Iontophoresis  Dexamethasone    Location  R achilles tendon    Dose  1.2 cc    Time  4 hour Stat Patch      Manual Therapy   Manual Therapy  Passive ROM;Soft  tissue mobilization    Soft tissue mobilization  achilles tendon and into calf    Passive ROM  R ankle all directions.      Ankle Exercises: Aerobic   Recumbent Bike  L0 x6 min       Ankle Exercises: Stretches   Gastroc Stretch  4 reps 15 seconds      Ankle Exercises: Standing   Heel Raises  15 reps black bar, x2     Other Standing Ankle Exercises  4in step downs 2x15, 6 inch step ups 2x10      Ankle Exercises: Seated   Other Seated Ankle Exercises  4 way ankle x15 each                PT Short Term Goals - 07/20/17 1204      PT SHORT TERM GOAL #1   Title  Pt will be independent in her HEP.     Status  Achieved        PT Long Term Goals - 08/03/17 1210      PT LONG TERM GOAL #2   Title  Pt will be able to amb community level distances with no pain reported for >/= 20 minutes.     Status  Achieved      PT LONG TERM GOAL #3   Title  Pt will improve her R DF to >/= 15 degrees in order to improve heel strike and gait pattern.     Status  Achieved            Plan - 08/03/17 1219    Clinical Impression Statement  Pt has progressed meeting some goals, she reports pulling sensation in cal with DF, I informed her this is normal. She does real well with functional interventions. Some R heel lift with step downs.    Rehab Potential  Good    PT Frequency  2x / week    PT Duration  6 weeks    PT Treatment/Interventions  Cryotherapy;Electrical Stimulation;Gait training;Ultrasound;Moist Heat;Iontophoresis 4mg /ml Dexamethasone;Stair training;Functional mobility training;Therapeutic activities;Therapeutic exercise;Balance training;Manual techniques;Patient/family education;Passive range of motion;Taping    PT Next Visit Plan  progress and check AROM       Patient will benefit from skilled therapeutic intervention in order to improve the following deficits and impairments:  Difficulty walking, Pain, Decreased balance, Decreased strength, Decreased mobility, Decreased  activity tolerance  Visit Diagnosis: Muscle weakness (generalized)  Pain in right ankle and joints of right foot     Problem List Patient Active Problem List   Diagnosis Date Noted  . SKIN LESION 03/18/2010  . Dyslipidemia 02/18/2010  . NEVI, MULTIPLE 06/20/2007  . PLANTAR FASCIITIS 05/13/2007  . Essential hypertension 10/11/2006  . GERD 10/11/2006    Scot Jun, PTA 08/03/2017, 12:20 PM  [place], [place], [address] Phone: [phone]   Fax:  [phone]  Name: MEGIN CONSALVO MRN: [id number] Date of Birth: 04-Dec-1945

## 2017-08-09 ENCOUNTER — Ambulatory Visit: Payer: PPO | Admitting: Physical Therapy

## 2017-08-09 ENCOUNTER — Encounter: Payer: Self-pay | Admitting: Physical Therapy

## 2017-08-09 DIAGNOSIS — M6281 Muscle weakness (generalized): Secondary | ICD-10-CM | POA: Diagnosis not present

## 2017-08-09 DIAGNOSIS — M25571 Pain in right ankle and joints of right foot: Secondary | ICD-10-CM

## 2017-08-09 NOTE — Therapy (Signed)
[place] [place], [place], [address] Phone: [phone]   Fax:  [phone]  Physical Therapy Treatment  Patient Details  Name: [name] MRN: [id number] Date of Birth: 1945/03/31 Referring Provider: Frederik Pear, MD   Encounter Date: 08/09/2017  PT End of Session - 08/09/17 1211    Visit Number  6    PT Start Time  6144    PT Stop Time  1215    PT Time Calculation (min)  40 min    Activity Tolerance  Patient tolerated treatment well    Behavior During Therapy  [facility] for tasks assessed/performed       Past Medical History:  Diagnosis Date  . GERD (gastroesophageal reflux disease)   . Gynecological examination    sees Dr. Kennith Gain in [place] for GYN exams  . Hyperlipidemia   . Hypertension     Past Surgical History:  Procedure Laterality Date  . ABDOMINAL HYSTERECTOMY    . COLONOSCOPY  Oct. 2008   diverticulae only, repeat in 10 yrs   . FACELIFT    . TONSILLECTOMY      There were no vitals filed for this visit.  Subjective Assessment - 08/09/17 1141    Subjective  Pt reports that she tried some exercises on the steps causing a shooting pain in the back of her R leg. Pt stated that the MD wanted her to try 6 PT treatment and today's was her last day.     Currently in Pain?  Yes    Pain Score  3     Pain Location  Ankle    Pain Orientation  Right                       [facility] Adult PT Treatment/Exercise - 08/09/17 0001      Ambulation/Gait   Stairs  Yes    Stairs Assistance  5: Supervision    Stair Management Technique  One rail Right;One rail Left;Alternating pattern    Number of Stairs  24    Height of Stairs  6    Gait Comments  one flight of stairs, some R achillies pain with descents       Modalities   Modalities  Iontophoresis      Iontophoresis   Type of Iontophoresis  Dexamethasone    Location  R achilles tendon    Dose  1.2 cc    Time  4 hour Stat  Patch      Manual Therapy   Manual Therapy  Passive ROM;Soft tissue mobilization    Soft tissue mobilization  achilles tendon and into calf    Passive ROM  R ankle all directions.      Ankle Exercises: Aerobic   Nustep  L4 x 5min       Ankle Exercises: Standing   Heel Raises  Both;2 seconds;20 reps      Ankle Exercises: Seated   Other Seated Ankle Exercises  4 way ankle x15 each                PT Short Term Goals - 07/20/17 1204      PT SHORT TERM GOAL #1   Title  Pt will be independent in her HEP.     Status  Achieved        PT Long Term Goals - 08/03/17 1210      PT LONG TERM GOAL #2   Title  Pt will be able to amb community level distances with no pain reported for >/= 20 minutes.     Status  Achieved      PT LONG TERM GOAL #3   Title  Pt will improve her R DF to >/= 15 degrees in order to improve heel strike and gait pattern.     Status  Achieved            Plan - 08/09/17 1212    Clinical Impression Statement  Good strength and ROM, knot  on achillis has decreased. Pt reports improvement but she want to be completely pain free.     Rehab Potential  Good    PT Frequency  2x / week    PT Duration  6 weeks    PT Treatment/Interventions  Cryotherapy;Electrical Stimulation;Gait training;Ultrasound;Moist Heat;Iontophoresis 4mg /ml Dexamethasone;Stair training;Functional mobility training;Therapeutic activities;Therapeutic exercise;Balance training;Manual techniques;Patient/family education;Passive range of motion;Taping    PT Next Visit Plan  Will place pt on 2 week hold for pt to call her provider. Pt will call and schedule more appointments within two weeks if she has a decrease in function. Will D/C if pt has not called       Patient will benefit from skilled therapeutic intervention in order to improve the following deficits and impairments:  Difficulty walking, Pain, Decreased balance, Decreased strength, Decreased mobility, Decreased activity  tolerance  Visit Diagnosis: Muscle weakness (generalized)  Pain in right ankle and joints of right foot     Problem List Patient Active Problem List   Diagnosis Date Noted  . SKIN LESION 03/18/2010  . Dyslipidemia 02/18/2010  . NEVI, MULTIPLE 06/20/2007  . PLANTAR FASCIITIS 05/13/2007  . Essential hypertension 10/11/2006  . GERD 10/11/2006    Scot Jun, PTA 08/09/2017, 12:14 PM  [place], [place], [address] Phone: [phone]   Fax:  [phone]  Name: [name] MRN: [id number] Date of Birth: Feb 26, 1946

## 2017-09-09 DIAGNOSIS — M7661 Achilles tendinitis, right leg: Secondary | ICD-10-CM | POA: Diagnosis not present

## 2017-10-04 ENCOUNTER — Encounter: Payer: Self-pay | Admitting: Physical Therapy

## 2017-10-04 ENCOUNTER — Ambulatory Visit: Payer: PPO | Attending: Orthopedic Surgery | Admitting: Physical Therapy

## 2017-10-04 DIAGNOSIS — R600 Localized edema: Secondary | ICD-10-CM

## 2017-10-04 DIAGNOSIS — M25571 Pain in right ankle and joints of right foot: Secondary | ICD-10-CM

## 2017-10-04 DIAGNOSIS — R262 Difficulty in walking, not elsewhere classified: Secondary | ICD-10-CM | POA: Insufficient documentation

## 2017-10-04 NOTE — Therapy (Signed)
[address] [place], [place], [address] Phone: [phone]   Fax:  [phone]  Physical Therapy Evaluation  Patient Details  Name: [name] MRN: [id number] Date of Birth: April 08, 1945 Referring Provider: Frederik Pear   Encounter Date: 10/04/2017  PT End of Session - 10/04/17 1229    Visit Number  1    Date for PT Re-Evaluation  12/04/17    PT Start Time  1200    PT Stop Time  1255    PT Time Calculation (min)  55 min    Activity Tolerance  Patient tolerated treatment well    Behavior During Therapy  [facility] for tasks assessed/performed       Past Medical History:  Diagnosis Date  . GERD (gastroesophageal reflux disease)   . Gynecological examination    sees Dr. Kennith Gain in [place] for GYN exams  . Hyperlipidemia   . Hypertension     Past Surgical History:  Procedure Laterality Date  . ABDOMINAL HYSTERECTOMY    . COLONOSCOPY  Oct. 2008   diverticulae only, repeat in 10 yrs   . FACELIFT    . TONSILLECTOMY      There were no vitals filed for this visit.   Subjective Assessment - 10/04/17 1157    Subjective  Patient reports ankle and achilles pain since last summer, she is not sure of a cause, reports that she had this previously.  She was seen here for 6 visits in May/June, we did ionto, she reports that she stopped due to feeling that she only gotten 6 visits approved and stopped.  She did have difficulty attending due to the bus schedule.  Reports that eh PT helped but reports that it continued to hurt    Limitations  Walking    How long can you stand comfortably?  10 minutes    How long can you walk comfortably?  15 minutes    Currently in Pain?  Yes    Pain Score  2     Pain Location  Ankle    Pain Orientation  Right;Posterior    Pain Descriptors / Indicators  Tightness;Sore    Pain Radiating Towards  pain in the calf from the heel    Pain Onset  More than a month ago    Pain  Frequency  Constant    Aggravating Factors   walking, pain up to 6/10    Pain Relieving Factors  ionto helped in the past, rest helps some pain can be 0/10    Effect of Pain on Daily Activities  difficulty walking to the store for groceries         [facility] PT Assessment - 10/04/17 0001      Assessment   Medical Diagnosis  R achilles tendonitis    Referring Provider  Frederik Pear    Prior Therapy  had 6 visits 2 months ago that helped some      Precautions   Precautions  None      Restrictions   Weight Bearing Restrictions  No      Balance Screen   Has the patient fallen in the past 6 months  No    Has the patient had a decrease in activity level because of a fear of falling?   No    Is the patient reluctant to leave their home because of a fear of falling?   No      Home Environment   Living  Environment  Private residence    Type of [place] to enter    Additional Comments  has to carry laundry up and down stairs      Prior Function   Level of Independence  Independent    Vocation  Retired    Leisure  no exercise      AROM   Right Ankle Dorsiflexion  5    Right Ankle Plantar Flexion  40    Right Ankle Inversion  22    Right Ankle Eversion  15      Strength   Right Ankle Dorsiflexion  4-/5    Right Ankle Plantar Flexion  4/5    Right Ankle Inversion  4/5    Right Ankle Eversion  4/5      Flexibility   Soft Tissue Assessment /Muscle Length  -- gastroc is tight but soleus is more so with pain      Palpation   Palpation comment  Pt with tenderness over R achilles tendon with swelling noted, some knots throughout, there is definite hypertrophy on the right, right achilles is about 2.5 x wider than the left      Ambulation/Gait   Gait Comments  gait is with toes out, has significant pronation with mid stance, has pain going up stairs                Objective measurements completed on examination: See above findings.       [facility] Adult PT Treatment/Exercise - 10/04/17 0001      Modalities   Modalities  Ultrasound;Vasopneumatic      Ultrasound   Ultrasound Location  right achilles    Ultrasound Parameters  100% 3.3 MHz 1.3 w/cm2    Ultrasound Goals  Edema;Pain      Vasopneumatic   Number Minutes Vasopneumatic   10 minutes    Vasopnuematic Location   Ankle    Vasopneumatic Pressure  Medium    Vasopneumatic Temperature   40               PT Short Term Goals - 10/04/17 1232      PT SHORT TERM GOAL #1   Title  Pt will be independent in her HEP.     Time  2    Period  Weeks    Status  New        PT Long Term Goals - 10/04/17 1233      PT LONG TERM GOAL #1   Title  decrease size of right achilles to within 1 cm of the left at its widest point    Time  8    Period  Weeks    Status  New      PT LONG TERM GOAL #2   Title  Pt will be able to amb community level distances with no pain reported for >/= 20 minutes.     Time  8    Period  Weeks    Status  New      PT LONG TERM GOAL #3   Title  Pt will improve her R DF to >/= 15 degrees in order to improve heel strike and gait pattern.     Time  8    Period  Weeks    Status  New      PT LONG TERM GOAL #4   Title  go up and down stairs without pain    Time  8  Period  Weeks    Status  New             Plan - 10/04/17 1229    Clinical Impression Statement  Patient with history of right achilles tendonitis for a number of years, saw [place] here in May/June for 6 visits.  She has significant hypertrophy/swelling in the right achilles.  She is very tender in the right achilles, has some mild loss of DF actively, gastroc and soleus are tight seems to have pain with soleus stretch.  She has pronation in the foot with mid stance    Clinical Presentation  Stable    Clinical Decision Making  Low    Rehab Potential  Good    PT Frequency  2x / week    PT Duration  8 weeks    PT Treatment/Interventions  Cryotherapy;Electrical  Stimulation;Gait training;Ultrasound;Moist Heat;Iontophoresis 4mg /ml Dexamethasone;Stair training;Functional mobility training;Therapeutic activities;Therapeutic exercise;Balance training;Manual techniques;Patient/family education;Passive range of motion;Taping;Vasopneumatic Device    PT Next Visit Plan  begin treatment of right achilles tendonitis    Consulted and Agree with Plan of Care  Patient       Patient will benefit from skilled therapeutic intervention in order to improve the following deficits and impairments:  Difficulty walking, Pain, Decreased balance, Decreased strength, Decreased mobility, Decreased activity tolerance, Decreased range of motion, Increased edema, Impaired flexibility  Visit Diagnosis: Pain in right ankle and joints of right foot - Plan: PT plan of care cert/re-cert  Localized edema - Plan: PT plan of care cert/re-cert  Difficulty in walking, not elsewhere classified - Plan: PT plan of care cert/re-cert     Problem List Patient Active Problem List   Diagnosis Date Noted  . SKIN LESION 03/18/2010  . Dyslipidemia 02/18/2010  . NEVI, MULTIPLE 06/20/2007  . PLANTAR FASCIITIS 05/13/2007  . Essential hypertension 10/11/2006  . GERD 10/11/2006    Sumner Boast., PT 10/04/2017, 12:41 PM  [place] [address] [place], [place], [address] Phone: [phone]   Fax:  [phone]  Name: [name] MRN: [id number] Date of Birth: [birth year]

## 2017-10-19 ENCOUNTER — Ambulatory Visit: Payer: PPO | Admitting: Physical Therapy

## 2017-10-27 ENCOUNTER — Ambulatory Visit: Payer: PPO | Admitting: Physical Therapy

## 2017-10-27 ENCOUNTER — Encounter: Payer: Self-pay | Admitting: Physical Therapy

## 2017-10-27 DIAGNOSIS — R600 Localized edema: Secondary | ICD-10-CM

## 2017-10-27 DIAGNOSIS — R262 Difficulty in walking, not elsewhere classified: Secondary | ICD-10-CM

## 2017-10-27 DIAGNOSIS — M25571 Pain in right ankle and joints of right foot: Secondary | ICD-10-CM

## 2017-10-27 NOTE — Therapy (Signed)
[address] [address] [place], [place], [address] Phone: [phone]   Fax:  [phone]  Physical Therapy Treatment  Patient Details  Name: [name] MRN: [id number] Date of Birth: 1945-06-12 Referring Provider: Frederik Pear   Encounter Date: 10/27/2017  PT End of Session - 10/27/17 1231    Visit Number  2    Date for PT Re-Evaluation  12/04/17    PT Start Time  1140    PT Stop Time  1230    PT Time Calculation (min)  50 min    Activity Tolerance  Patient tolerated treatment well    Behavior During Therapy  [facility] for tasks assessed/performed       Past Medical History:  Diagnosis Date  . GERD (gastroesophageal reflux disease)   . Gynecological examination    sees Dr. Kennith Gain in [place] for GYN exams  . Hyperlipidemia   . Hypertension     Past Surgical History:  Procedure Laterality Date  . ABDOMINAL HYSTERECTOMY    . COLONOSCOPY  Oct. 2008   diverticulae only, repeat in 10 yrs   . FACELIFT    . TONSILLECTOMY      There were no vitals filed for this visit.  Subjective Assessment - 10/27/17 1141    Subjective  No change since evaluation. reports that her heel has been aching more because she has been moving and going up and down the stairs.    Currently in Pain?  No/denies    Pain Score  0-No pain                       [facility] Adult PT Treatment/Exercise - 10/27/17 0001      Exercises   Exercises  Ankle      Ultrasound   Ultrasound Location  r achillies     Ultrasound Parameters  100% 3.3MHz 1.3w/cm2    Ultrasound Goals  Edema;Pain      Iontophoresis   Type of Iontophoresis  Dexamethasone    Location  R achilles tendon    Dose  1.2 cc    Time  4 hour Stat Patch      Ankle Exercises: Stretches   Soleus Stretch  10 seconds;5 reps    Gastroc Stretch  20 seconds;4 reps      Ankle Exercises: Aerobic   Recumbent Bike  L0 x 4 min     Nustep  L2 x 59min       Ankle  Exercises: Seated   Ankle Circles/Pumps  Right;20 reps    Other Seated Ankle Exercises  4 way ankle red x20 each                PT Short Term Goals - 10/04/17 1232      PT SHORT TERM GOAL #1   Title  Pt will be independent in her HEP.     Time  2    Period  Weeks    Status  New        PT Long Term Goals - 10/04/17 1233      PT LONG TERM GOAL #1   Title  decrease size of right achilles to within 1 cm of the left at its widest point    Time  8    Period  Weeks    Status  New      PT LONG TERM GOAL #2   Title  Pt will be able to amb  community level distances with no pain reported for >/= 20 minutes.     Time  8    Period  Weeks    Status  New      PT LONG TERM GOAL #3   Title  Pt will improve her R DF to >/= 15 degrees in order to improve heel strike and gait pattern.     Time  8    Period  Weeks    Status  New      PT LONG TERM GOAL #4   Title  go up and down stairs without pain    Time  8    Period  Weeks    Status  New            Plan - 10/27/17 1232    Clinical Impression Statement  Pt enters clinic wearing inserts in her shoes. She stated some pain in her R achillis with 4 way ankle exercises. No tenderness reported with stretching of gastroc and soleus.     Rehab Potential  Good    PT Frequency  2x / week    PT Duration  8 weeks    PT Treatment/Interventions  Cryotherapy;Electrical Stimulation;Gait training;Ultrasound;Moist Heat;Iontophoresis 4mg /ml Dexamethasone;Stair training;Functional mobility training;Therapeutic activities;Therapeutic exercise;Balance training;Manual techniques;Patient/family education;Passive range of motion;Taping;Vasopneumatic Device    PT Next Visit Plan  treatment of right achilles tendonitis       Patient will benefit from skilled therapeutic intervention in order to improve the following deficits and impairments:  Difficulty walking, Pain, Decreased balance, Decreased strength, Decreased mobility, Decreased activity  tolerance, Decreased range of motion, Increased edema, Impaired flexibility  Visit Diagnosis: Pain in right ankle and joints of right foot  Localized edema  Difficulty in walking, not elsewhere classified     Problem List Patient Active Problem List   Diagnosis Date Noted  . SKIN LESION 03/18/2010  . Dyslipidemia 02/18/2010  . NEVI, MULTIPLE 06/20/2007  . PLANTAR FASCIITIS 05/13/2007  . Essential hypertension 10/11/2006  . GERD 10/11/2006    Scot Jun, PTA 10/27/2017, 12:38 PM  [address] [place], [place], [address] Phone: [phone]   Fax:  [phone]  Name: [name] MRN: [id number] Date of Birth: 08/23/1945

## 2017-11-02 ENCOUNTER — Encounter: Payer: Self-pay | Admitting: Physical Therapy

## 2017-11-02 ENCOUNTER — Ambulatory Visit: Payer: PPO | Attending: Orthopedic Surgery | Admitting: Physical Therapy

## 2017-11-02 DIAGNOSIS — M6281 Muscle weakness (generalized): Secondary | ICD-10-CM | POA: Diagnosis not present

## 2017-11-02 DIAGNOSIS — R600 Localized edema: Secondary | ICD-10-CM | POA: Diagnosis not present

## 2017-11-02 DIAGNOSIS — R262 Difficulty in walking, not elsewhere classified: Secondary | ICD-10-CM

## 2017-11-02 DIAGNOSIS — M25571 Pain in right ankle and joints of right foot: Secondary | ICD-10-CM | POA: Diagnosis not present

## 2017-11-02 NOTE — Therapy (Signed)
[place] [address] [place], [place], [address] Phone: [phone]   Fax:  [phone]  Physical Therapy Treatment  Patient Details  Name: [name] MRN: [id number] Date of Birth: 09-Aug-1945 Referring Provider: Frederik Pear   Encounter Date: 11/02/2017  PT End of Session - 11/02/17 1221    Visit Number  3    Number of Visits  13    Date for PT Re-Evaluation  12/04/17    PT Start Time  9935    PT Stop Time  1235    PT Time Calculation (min)  50 min    Activity Tolerance  Patient tolerated treatment well    Behavior During Therapy  [facility] for tasks assessed/performed       Past Medical History:  Diagnosis Date  . GERD (gastroesophageal reflux disease)   . Gynecological examination    sees Dr. Kennith Gain in [place] for GYN exams  . Hyperlipidemia   . Hypertension     Past Surgical History:  Procedure Laterality Date  . ABDOMINAL HYSTERECTOMY    . COLONOSCOPY  Oct. 2008   diverticulae only, repeat in 10 yrs   . FACELIFT    . TONSILLECTOMY      There were no vitals filed for this visit.  Subjective Assessment - 11/02/17 1149    Subjective  "Ankle is all right I guess"    Currently in Pain?  No/denies    Pain Score  0-No pain         [facility] PT Assessment - 11/02/17 0001      AROM   Right Ankle Dorsiflexion  12    Right Ankle Plantar Flexion  50    Right Ankle Inversion  33    Right Ankle Eversion  30                   [facility] Adult PT Treatment/Exercise - 11/02/17 0001      Iontophoresis   Type of Iontophoresis  Dexamethasone    Location  R achilles tendon    Dose  1.2 cc    Time  4 hour Stat Patch      Vasopneumatic   Number Minutes Vasopneumatic   10 minutes    Vasopnuematic Location   Ankle    Vasopneumatic Pressure  Medium    Vasopneumatic Temperature   40      Manual Therapy   Manual Therapy  Passive ROM;Soft tissue mobilization    Passive ROM  R ankle all  directions.      Ankle Exercises: Aerobic   Recumbent Bike  L0 x 4 min     Nustep  L2 LE only x5 min       Ankle Exercises: Seated   Ankle Circles/Pumps  Right;20 reps    Other Seated Ankle Exercises  4 way ankle red x20 each       Ankle Exercises: Standing   Other Standing Ankle Exercises  heel raises 2x10     Other Standing Ankle Exercises  6in step ups RLE x10       Ankle Exercises: Stretches   Gastroc Stretch  20 seconds;3 reps               PT Short Term Goals - 10/04/17 1232      PT SHORT TERM GOAL #1   Title  Pt will be independent in her HEP.     Time  2    Period  Weeks  Status  New        PT Long Term Goals - 11/02/17 1224      PT LONG TERM GOAL #1   Title  decrease size of right achilles to within 1 cm of the left at its widest point    Status  On-going      PT LONG TERM GOAL #2   Title  Pt will be able to amb community level distances with no pain reported for >/= 20 minutes.     Status  On-going      PT LONG TERM GOAL #3   Title  Pt will improve her R DF to >/= 15 degrees in order to improve heel strike and gait pattern.     Status  Partially Met            Plan - 11/02/17 1222    Clinical Impression Statement  Pt did well with today's activities. She does c/o R knee pain during aerobic warm up. R ankle ROM has improved overall. Good strength with today's activities but does report some pain occasional R achillis pain with ADLS    Rehab Potential  Good    PT Frequency  2x / week    PT Treatment/Interventions  Cryotherapy;Electrical Stimulation;Gait training;Ultrasound;Moist Heat;Iontophoresis '4mg'$ /ml Dexamethasone;Stair training;Functional mobility training;Therapeutic activities;Therapeutic exercise;Balance training;Manual techniques;Patient/family education;Passive range of motion;Taping;Vasopneumatic Device    PT Next Visit Plan  treatment of right achilles tendonitis       Patient will benefit from skilled therapeutic intervention  in order to improve the following deficits and impairments:  Difficulty walking, Pain, Decreased balance, Decreased strength, Decreased mobility, Decreased activity tolerance, Decreased range of motion, Increased edema, Impaired flexibility  Visit Diagnosis: Pain in right ankle and joints of right foot  Localized edema  Difficulty in walking, not elsewhere classified  Muscle weakness (generalized)     Problem List Patient Active Problem List   Diagnosis Date Noted  . SKIN LESION 03/18/2010  . Dyslipidemia 02/18/2010  . NEVI, MULTIPLE 06/20/2007  . PLANTAR FASCIITIS 05/13/2007  . Essential hypertension 10/11/2006  . GERD 10/11/2006    Scot Jun, PTA 11/02/2017, 12:24 PM  [place] [address] [place], [place], [address] Phone: [phone]   Fax:  [phone]  Name: [name] MRN: [id number] Date of Birth: January 25, 1946

## 2017-11-10 ENCOUNTER — Ambulatory Visit: Payer: PPO | Admitting: Physical Therapy

## 2017-11-10 ENCOUNTER — Encounter: Payer: Self-pay | Admitting: Physical Therapy

## 2017-11-10 DIAGNOSIS — R262 Difficulty in walking, not elsewhere classified: Secondary | ICD-10-CM

## 2017-11-10 DIAGNOSIS — M25571 Pain in right ankle and joints of right foot: Secondary | ICD-10-CM | POA: Diagnosis not present

## 2017-11-10 DIAGNOSIS — R600 Localized edema: Secondary | ICD-10-CM

## 2017-11-10 NOTE — Therapy (Signed)
[address] [address] [place], [place], [address] Phone: [phone]   Fax:  [phone]  Physical Therapy Treatment  Patient Details  Name: [name] MRN: [id number] Date of Birth: 1945-04-29 Referring Provider: Frederik Pear   Encounter Date: 11/10/2017  PT End of Session - 11/10/17 1212    Visit Number  4    Date for PT Re-Evaluation  12/04/17    PT Start Time  1132    PT Stop Time  1215    PT Time Calculation (min)  43 min    Activity Tolerance  Patient tolerated treatment well    Behavior During Therapy  [facility] for tasks assessed/performed       Past Medical History:  Diagnosis Date  . GERD (gastroesophageal reflux disease)   . Gynecological examination    sees Dr. Kennith Gain in [place] for GYN exams  . Hyperlipidemia   . Hypertension     Past Surgical History:  Procedure Laterality Date  . ABDOMINAL HYSTERECTOMY    . COLONOSCOPY  Oct. 2008   diverticulae only, repeat in 10 yrs   . FACELIFT    . TONSILLECTOMY      There were no vitals filed for this visit.  Subjective Assessment - 11/10/17 1133    Subjective  "It just does not feel any different"     Currently in Pain?  No/denies    Pain Score  0-No pain                       [facility] Adult PT Treatment/Exercise - 11/10/17 0001      Ambulation/Gait   Stairs  Yes    Stairs Assistance  5: Supervision    Stair Management Technique  One rail Right;One rail Left;Alternating pattern    Number of Stairs  36    Height of Stairs  6      High Level Balance   High Level Balance Activities  Backward walking;Tandem walking      Iontophoresis   Type of Iontophoresis  Dexamethasone    Location  R achilles tendon    Dose  1.2 cc    Time  4 hour Stat Patch      Manual Therapy   Manual Therapy  Passive ROM;Soft tissue mobilization    Passive ROM  R ankle all directions.      Ankle Exercises: Seated   Other Seated Ankle Exercises   4 way ankle red x20 each       Ankle Exercises: Standing   Heel Raises  15 reps    Toe Raise  15 reps    Other Standing Ankle Exercises  20lb resisted gait 4 way                PT Short Term Goals - 10/04/17 1232      PT SHORT TERM GOAL #1   Title  Pt will be independent in her HEP.     Time  2    Period  Weeks    Status  New        PT Long Term Goals - 11/10/17 1211      PT LONG TERM GOAL #1   Title  decrease size of right achilles to within 1 cm of the left at its widest point    Status  Partially Met      PT LONG TERM GOAL #2   Title  Pt will be able to  amb community level distances with no pain reported for >/= 20 minutes.     Status  Partially Met      PT LONG TERM GOAL #3   Title  Pt will improve her R DF to >/= 15 degrees in order to improve heel strike and gait pattern.     Status  Partially Met      PT LONG TERM GOAL #4   Title  go up and down stairs without pain    Status  Achieved            Plan - 11/10/17 1213    Clinical Impression Statement  Pt reports not much change overall, but no real functional limitations at home. Pain usually comes from standing after sitting for an extended period of time. No issues with today's activities, reports some L hip irritation with tandem walking. Some LOB during balance interventions.    Rehab Potential  Good    PT Frequency  2x / week    PT Duration  8 weeks    PT Treatment/Interventions  Cryotherapy;Electrical Stimulation;Gait training;Ultrasound;Moist Heat;Iontophoresis '4mg'$ /ml Dexamethasone;Stair training;Functional mobility training;Therapeutic activities;Therapeutic exercise;Balance training;Manual techniques;Patient/family education;Passive range of motion;Taping;Vasopneumatic Device    PT Next Visit Plan  treatment of right achilles tendonitis       Patient will benefit from skilled therapeutic intervention in order to improve the following deficits and impairments:  Difficulty walking, Pain,  Decreased balance, Decreased strength, Decreased mobility, Decreased activity tolerance, Decreased range of motion, Increased edema, Impaired flexibility  Visit Diagnosis: Pain in right ankle and joints of right foot  Localized edema  Difficulty in walking, not elsewhere classified     Problem List Patient Active Problem List   Diagnosis Date Noted  . SKIN LESION 03/18/2010  . Dyslipidemia 02/18/2010  . NEVI, MULTIPLE 06/20/2007  . PLANTAR FASCIITIS 05/13/2007  . Essential hypertension 10/11/2006  . GERD 10/11/2006    Scot Jun, PTA 11/10/2017, 12:15 PM  [place] [address] [place], [place], [address] Phone: [phone]   Fax:  [phone]  Name: [name] MRN: [id number] Date of Birth: November 28, 1945

## 2017-11-24 ENCOUNTER — Encounter: Payer: Self-pay | Admitting: Physical Therapy

## 2017-11-24 ENCOUNTER — Ambulatory Visit: Payer: PPO | Admitting: Physical Therapy

## 2017-11-24 DIAGNOSIS — M25571 Pain in right ankle and joints of right foot: Secondary | ICD-10-CM | POA: Diagnosis not present

## 2017-11-24 DIAGNOSIS — M6281 Muscle weakness (generalized): Secondary | ICD-10-CM

## 2017-11-24 DIAGNOSIS — R262 Difficulty in walking, not elsewhere classified: Secondary | ICD-10-CM

## 2017-11-24 DIAGNOSIS — R600 Localized edema: Secondary | ICD-10-CM

## 2017-11-24 NOTE — Therapy (Signed)
[address] [place], [place], [address] Phone: [phone]   Fax:  [phone]  Physical Therapy Treatment  Patient Details  Name: [name] MRN: [id number] Date of Birth: March 17, 1945 Referring Provider: Frederik Pear   Encounter Date: 11/24/2017  PT End of Session - 11/24/17 1213    Visit Number  5    Date for PT Re-Evaluation  12/04/17    PT Start Time  1135    PT Stop Time  1215    PT Time Calculation (min)  40 min    Activity Tolerance  Patient tolerated treatment well    Behavior During Therapy  [facility] for tasks assessed/performed       Past Medical History:  Diagnosis Date  . GERD (gastroesophageal reflux disease)   . Gynecological examination    sees Dr. Kennith Gain in [place] for GYN exams  . Hyperlipidemia   . Hypertension     Past Surgical History:  Procedure Laterality Date  . ABDOMINAL HYSTERECTOMY    . COLONOSCOPY  Oct. 2008   diverticulae only, repeat in 10 yrs   . FACELIFT    . TONSILLECTOMY      There were no vitals filed for this visit.  Subjective Assessment - 11/24/17 1138    Subjective  "I feel ok"    Currently in Pain?  No/denies    Pain Score  0-No pain                       [facility] Adult PT Treatment/Exercise - 11/24/17 0001      Iontophoresis   Type of Iontophoresis  Dexamethasone    Location  R achilles tendon    Dose  1.2 cc    Time  4 hour Stat Patch      Ankle Exercises: Seated   ABC's  2 reps    Ankle Circles/Pumps  Right;20 reps    Other Seated Ankle Exercises  4 way ankle 5reen x20 each       Ankle Exercises: Standing   Heel Raises  15 reps   x2   Toe Raise  15 reps   x2   Other Standing Ankle Exercises  heel raises 2x15       Ankle Exercises: Stretches   Gastroc Stretch  20 seconds;3 reps               PT Short Term Goals - 10/04/17 1232      PT SHORT TERM GOAL #1   Title  Pt will be independent in her HEP.      Time  2    Period  Weeks    Status  New        PT Long Term Goals - 11/24/17 1213      PT LONG TERM GOAL #1   Title  decrease size of right achilles to within 1 cm of the left at its widest point    Status  Partially Met      PT LONG TERM GOAL #2   Title  Pt will be able to amb community level distances with no pain reported for >/= 20 minutes.     Status  Partially Met      PT LONG TERM GOAL #3   Title  Pt will improve her R DF to >/= 15 degrees in order to improve heel strike and gait pattern.     Status  Partially Met  PT LONG TERM GOAL #4   Title  go up and down stairs without pain    Status  Achieved            Plan - 11/24/17 1214    Clinical Impression Statement  Pt reported on fall while she was bending down cleaning in the bathroom. No injuries sustained and pt did not seek medical attention. Pt reports no functional limitation on bouts of mild L ankle pain. Avoided aerobic warm up du to pt c/o R knee pain. L ankle fatigue noted with heel and toe raises.     Rehab Potential  Good    PT Frequency  2x / week    PT Duration  8 weeks    PT Treatment/Interventions  Cryotherapy;Electrical Stimulation;Gait training;Ultrasound;Moist Heat;Iontophoresis 30m/ml Dexamethasone;Stair training;Functional mobility training;Therapeutic activities;Therapeutic exercise;Balance training;Manual techniques;Patient/family education;Passive range of motion;Taping;Vasopneumatic Device    PT Next Visit Plan  treatment of right achilles tendonitis       Patient will benefit from skilled therapeutic intervention in order to improve the following deficits and impairments:  Difficulty walking, Pain, Decreased balance, Decreased strength, Decreased mobility, Decreased activity tolerance, Decreased range of motion, Increased edema, Impaired flexibility  Visit Diagnosis: Pain in right ankle and joints of right foot  Localized edema  Difficulty in walking, not elsewhere  classified  Muscle weakness (generalized)     Problem List Patient Active Problem List   Diagnosis Date Noted  . SKIN LESION 03/18/2010  . Dyslipidemia 02/18/2010  . NEVI, MULTIPLE 06/20/2007  . PLANTAR FASCIITIS 05/13/2007  . Essential hypertension 10/11/2006  . GERD 10/11/2006    RScot Jun PTA 11/24/2017, 12:16 PM  [place] [address] [address]Phone: [phone]  Fax:  [phone] Name: Lauren BERGQUISTMRN: [id number] of Birth: [birth year]

## 2017-12-01 ENCOUNTER — Encounter: Payer: Self-pay | Admitting: Physical Therapy

## 2017-12-01 ENCOUNTER — Ambulatory Visit: Payer: PPO | Attending: Orthopedic Surgery | Admitting: Physical Therapy

## 2017-12-01 DIAGNOSIS — M6281 Muscle weakness (generalized): Secondary | ICD-10-CM | POA: Diagnosis not present

## 2017-12-01 DIAGNOSIS — M25571 Pain in right ankle and joints of right foot: Secondary | ICD-10-CM | POA: Diagnosis not present

## 2017-12-01 DIAGNOSIS — R262 Difficulty in walking, not elsewhere classified: Secondary | ICD-10-CM | POA: Diagnosis not present

## 2017-12-01 DIAGNOSIS — R600 Localized edema: Secondary | ICD-10-CM | POA: Diagnosis not present

## 2017-12-01 NOTE — Therapy (Signed)
[address] [place], [place], [address] Phone: [phone]   Fax:  [phone]  Physical Therapy Treatment  Patient Details  Name: [name] MRN: [id number] Date of Birth: September 08, 1945 Referring Provider (PT): Frederik Pear   Encounter Date: 12/01/2017  PT End of Session - 12/01/17 1224    Visit Number  6    Date for PT Re-Evaluation  12/04/17    PT Start Time  0786    PT Stop Time  1225    PT Time Calculation (min)  40 min    Activity Tolerance  Patient tolerated treatment well    Behavior During Therapy  [facility] for tasks assessed/performed       Past Medical History:  Diagnosis Date  . GERD (gastroesophageal reflux disease)   . Gynecological examination    sees Dr. Kennith Gain in [place] for GYN exams  . Hyperlipidemia   . Hypertension     Past Surgical History:  Procedure Laterality Date  . ABDOMINAL HYSTERECTOMY    . COLONOSCOPY  Oct. 2008   diverticulae only, repeat in 10 yrs   . FACELIFT    . TONSILLECTOMY      There were no vitals filed for this visit.  Subjective Assessment - 12/01/17 1148    Subjective  "About the same" Pt reports that she continues to have an aching sensation in her R achillis when negotiating stairs. No issues ambulating on flat ground.     Currently in Pain?  No/denies    Pain Score  0-No pain         [facility] PT Assessment - 12/01/17 0001      AROM   Right Ankle Dorsiflexion  15    Right Ankle Plantar Flexion  55    Right Ankle Inversion  30    Right Ankle Eversion  25                   [facility] Adult PT Treatment/Exercise - 12/01/17 0001      Ambulation/Gait   Gait Comments  one flight of stairs       Iontophoresis   Type of Iontophoresis  Dexamethasone    Location  R achilles tendon    Dose  1.2 cc    Time  4 hour Stat Patch      Ankle Exercises: Seated   ABC's  2 reps    Ankle Circles/Pumps  Right;20 reps    Other Seated Ankle  Exercises  4 way ankle 5reen x20 each       Ankle Exercises: Standing   Toe Raise  15 reps   black bar x2    Other Standing Ankle Exercises  heel raises 2x15       Ankle Exercises: Stretches   Gastroc Stretch  20 seconds;3 reps               PT Short Term Goals - 10/04/17 1232      PT SHORT TERM GOAL #1   Title  Pt will be independent in her HEP.     Time  2    Period  Weeks    Status  New        PT Long Term Goals - 11/24/17 1213      PT LONG TERM GOAL #1   Title  decrease size of right achilles to within 1 cm of the left at its widest point    Status  Partially Met  PT LONG TERM GOAL #2   Title  Pt will be able to amb community level distances with no pain reported for >/= 20 minutes.     Status  Partially Met      PT LONG TERM GOAL #3   Title  Pt will improve her R DF to >/= 15 degrees in order to improve heel strike and gait pattern.     Status  Partially Met      PT LONG TERM GOAL #4   Title  go up and down stairs without pain    Status  Achieved            Plan - 12/01/17 1226    Clinical Impression Statement  Pt reports no real improvement but also no functional limitations. A slight decrease in R ankle ROM but overall ROM remains well. Co issues with today's exercises, did report some aching in R ankle ascending stairs.    Rehab Potential  Good    PT Frequency  2x / week    PT Duration  8 weeks    PT Treatment/Interventions  Cryotherapy;Electrical Stimulation;Gait training;Ultrasound;Moist Heat;Iontophoresis 49m/ml Dexamethasone;Stair training;Functional mobility training;Therapeutic activities;Therapeutic exercise;Balance training;Manual techniques;Patient/family education;Passive range of motion;Taping;Vasopneumatic Device    PT Next Visit Plan  treatment of right achilles tendonitis       Patient will benefit from skilled therapeutic intervention in order to improve the following deficits and impairments:  Difficulty walking, Pain,  Decreased balance, Decreased strength, Decreased mobility, Decreased activity tolerance, Decreased range of motion, Increased edema, Impaired flexibility  Visit Diagnosis: Pain in right ankle and joints of right foot  Localized edema  Difficulty in walking, not elsewhere classified  Muscle weakness (generalized)     Problem List Patient Active Problem List   Diagnosis Date Noted  . SKIN LESION 03/18/2010  . Dyslipidemia 02/18/2010  . NEVI, MULTIPLE 06/20/2007  . PLANTAR FASCIITIS 05/13/2007  . Essential hypertension 10/11/2006  . GERD 10/11/2006    RScot Jun PTA 12/01/2017, 12:29 PM  [address] [address]Phone: [phone]  Fax:  [phone] Name: Lauren SCHUKNECHTMRN: [id number] of Birth: [birth year]

## 2017-12-15 ENCOUNTER — Ambulatory Visit: Payer: PPO | Admitting: Physical Therapy

## 2017-12-22 ENCOUNTER — Encounter: Payer: PPO | Admitting: Physical Therapy

## 2017-12-29 ENCOUNTER — Encounter: Payer: PPO | Admitting: Physical Therapy

## 2018-02-14 DIAGNOSIS — H2513 Age-related nuclear cataract, bilateral: Secondary | ICD-10-CM | POA: Diagnosis not present

## 2018-03-15 DIAGNOSIS — Z6826 Body mass index (BMI) 26.0-26.9, adult: Secondary | ICD-10-CM | POA: Diagnosis not present

## 2018-03-15 DIAGNOSIS — Z01419 Encounter for gynecological examination (general) (routine) without abnormal findings: Secondary | ICD-10-CM | POA: Diagnosis not present

## 2018-04-05 ENCOUNTER — Telehealth: Payer: Self-pay

## 2018-04-05 DIAGNOSIS — M810 Age-related osteoporosis without current pathological fracture: Secondary | ICD-10-CM

## 2018-04-05 NOTE — Telephone Encounter (Signed)
Copied from [place] [id number]. Topic: Referral - Request for Referral >> Apr 05, 2018  8:52 AM Rayann Heman wrote: Referral for which specialty: Bone Density test  Preferred provider/office: [facility] center  Reason for referral:getting mammogram and wants to get bone density at the same time.

## 2018-04-05 NOTE — Telephone Encounter (Signed)
Dr. Sarajane Jews please advise on order for the BMD at the breast center when she gets her mammogram---

## 2018-04-07 NOTE — Telephone Encounter (Signed)
I ordered the DEXA.

## 2018-04-11 ENCOUNTER — Other Ambulatory Visit: Payer: Self-pay | Admitting: Family Medicine

## 2018-04-11 DIAGNOSIS — Z1231 Encounter for screening mammogram for malignant neoplasm of breast: Secondary | ICD-10-CM

## 2018-05-11 ENCOUNTER — Ambulatory Visit: Payer: PPO

## 2018-05-11 ENCOUNTER — Other Ambulatory Visit: Payer: PPO

## 2018-06-01 ENCOUNTER — Ambulatory Visit: Payer: PPO

## 2018-06-08 ENCOUNTER — Ambulatory Visit: Payer: PPO

## 2018-06-16 ENCOUNTER — Encounter: Payer: PPO | Admitting: Family Medicine

## 2018-06-27 ENCOUNTER — Telehealth: Payer: Self-pay | Admitting: *Deleted

## 2018-06-27 NOTE — Telephone Encounter (Signed)
Copied from [place] [phone]. Topic: General - Inquiry >> Jun 27, 2018  8:24 AM Richardo Priest, NT wrote: Reason for CRM: Patient called in stating she would like to have a colonoscopy due to having bowel issues. Requesting one be done soon due to not having one last year. Call back is [phone].

## 2018-06-27 NOTE — Telephone Encounter (Signed)
Dr. Fry please advise. Thanks  

## 2018-06-28 NOTE — Telephone Encounter (Signed)
Unfortunately no one is doing colonoscopies now because of the virus pandemic

## 2018-06-29 NOTE — Telephone Encounter (Signed)
Called the pt to r/s the CPE and she wanted to know why no one has given her a call back she was given the information that Dr. Sarajane Jews stated and she wanted to now if she should call 911 if she gets to feeling worse I told her please do so.

## 2018-07-08 ENCOUNTER — Other Ambulatory Visit: Payer: PPO

## 2018-07-11 ENCOUNTER — Telehealth: Payer: Self-pay | Admitting: *Deleted

## 2018-07-11 DIAGNOSIS — Z0000 Encounter for general adult medical examination without abnormal findings: Secondary | ICD-10-CM

## 2018-07-11 NOTE — Telephone Encounter (Signed)
Copied from [place] [phone]. Topic: General - Other >> Jul 11, 2018 12:44 PM Antonieta Iba C wrote: Reason for CRM: pt is requesting a order for a colonoscopy. Pt says that she has been experiencing some cramping and bloating. Pt says that she has a twisted bowl and has to take laxatives on a regular basis. Pt doesn't have a preference for a location.   Pt would like further assistance.   CB: [phone]

## 2018-07-11 NOTE — Telephone Encounter (Signed)
Pt is requesting a referral for the colonoscopy.  Dr. Sarajane Jews please advise. Thanks

## 2018-07-11 NOTE — Telephone Encounter (Signed)
The referral was done  

## 2018-07-11 NOTE — Addendum Note (Signed)
Addended by: Alysia Penna A on: 07/11/2018 05:08 PM   Modules accepted: Orders

## 2018-07-21 ENCOUNTER — Encounter: Payer: PPO | Admitting: Family Medicine

## 2018-08-15 ENCOUNTER — Other Ambulatory Visit: Payer: Self-pay

## 2018-08-15 ENCOUNTER — Ambulatory Visit
Admission: RE | Admit: 2018-08-15 | Discharge: 2018-08-15 | Disposition: A | Discharge status: Home / Self Care | Payer: PPO | Source: Ambulatory Visit | Attending: Family Medicine | Admitting: Family Medicine

## 2018-08-15 DIAGNOSIS — Z1231 Encounter for screening mammogram for malignant neoplasm of breast: Secondary | ICD-10-CM | POA: Diagnosis not present

## 2018-08-15 DIAGNOSIS — M810 Age-related osteoporosis without current pathological fracture: Secondary | ICD-10-CM

## 2018-08-15 DIAGNOSIS — Z780 Asymptomatic menopausal state: Secondary | ICD-10-CM | POA: Diagnosis not present

## 2018-08-15 DIAGNOSIS — Z13820 Encounter for screening for osteoporosis: Secondary | ICD-10-CM | POA: Diagnosis not present

## 2018-08-17 ENCOUNTER — Encounter: Payer: Self-pay | Admitting: Gastroenterology

## 2018-08-17 ENCOUNTER — Ambulatory Visit (INDEPENDENT_AMBULATORY_CARE_PROVIDER_SITE_OTHER): Payer: PPO | Admitting: Gastroenterology

## 2018-08-17 ENCOUNTER — Other Ambulatory Visit: Payer: Self-pay

## 2018-08-17 VITALS — Ht 66.0 in | Wt 165.0 lb

## 2018-08-17 DIAGNOSIS — R140 Abdominal distension (gaseous): Secondary | ICD-10-CM | POA: Diagnosis not present

## 2018-08-17 DIAGNOSIS — Z1211 Encounter for screening for malignant neoplasm of colon: Secondary | ICD-10-CM | POA: Diagnosis not present

## 2018-08-17 MED ORDER — NA SULFATE-K SULFATE-MG SULF 17.5-3.13-1.6 GM/177ML PO SOLN: 1.0000 | Freq: Once | ORAL | 0 refills | Status: AC

## 2018-08-17 NOTE — Patient Instructions (Signed)
If you are age 75 or older, your body mass index should be between 23-30. Your Body mass index is 26.63 kg/m. If this is out of the aforementioned range listed, please consider follow up with your Primary Care Provider.  If you are age 74 or younger, your body mass index should be between 19-25. Your Body mass index is 26.63 kg/m. If this is out of the aformentioned range listed, please consider follow up with your Primary Care Provider.   You have been scheduled for a colonoscopy. Please follow written instructions given to you at your visit today.  Please pick up your prep supplies at the pharmacy within the next 1-3 days. If you use inhalers (even only as needed), please bring them with you on the day of your procedure. Your physician has requested that you go to www.startemmi.com and enter the access code given to you at your visit today. This web site gives a general overview about your procedure. However, you should still follow specific instructions given to you by our office regarding your preparation for the procedure.  It was a pleasure to see you today!  Dr. Loletha Carrow

## 2018-08-17 NOTE — Addendum Note (Signed)
Addended by: Lanny Hurst A on: 08/17/2018 11:39 AM   Modules accepted: Orders

## 2018-08-17 NOTE — Progress Notes (Signed)
This patient contacted our office requesting a physician telemedicine consultation regarding clinical questions and/or test results. Interactive audio and video telecommunications were attempted between this provider and the patient.  However, this technology failed due to the patient having technical difficulties OR they did not have access to video capabilities.  We continued and completed the visit with audio only.  If new patient, they were referred by Alysia Penna, MD  Participants on the conference : myself and patient   The patient consented to this consultation and was aware that a charge will be placed through their insurance.  I was in my office and the patient was at home   Encounter time:  Total time 22 minutes, with 15 minutes spent with patient on phone   _____________________________________________________________________________________________              [name] Gastroenterology Consult Note:  History: [name] 08/17/2018  Referring provider: Laurey Morale, MD  Reason for consult/chief complaint: No chief complaint on file.   Subjective  HPI: Colon 11/30/07 by Dr. Eddie Dibbles in [place], [place] - not full report. Only several photos and instruction page.  Left sided diverticulosis.  Told her "twisted colon".  2019 saw PCP with bloating and gas, got better with probiotics. Several months returned symptoms, and occasional lower cramps. She recalls a CTAP around then that was normal. BM's regular if takes MOM nightly.  Stools thin for many years. No bleeding.  ROS:  Review of Systems No chest pain, dyspnea, dysuria Chronic fatigue  Past Medical History: Past Medical History:  Diagnosis Date  . GERD (gastroesophageal reflux disease)   . Gynecological examination    sees Dr. Kennith Gain in [place] for GYN exams  . Hyperlipidemia   . Hypertension     Past Surgical History:  Procedure Laterality Date  . ABDOMINAL HYSTERECTOMY    . COLONOSCOPY   Sept 2009   diverticulae only, repeat in 10 yrs   . FACELIFT    . TONSILLECTOMY        Family History: Family History  Problem Relation Age of Onset  . Arthritis Other   . Heart disease Other   . Breast cancer Maternal Aunt   . Breast cancer Maternal Grandmother   . Colon cancer Neg Hx   . Stomach cancer Neg Hx   . Pancreatic cancer Neg Hx   . Esophageal cancer Neg Hx     Social History: Social History   Socioeconomic History  . Marital status: Single    Spouse name: Not on file  . Number of children: Not on file  . Years of education: Not on file  . Highest education level: Not on file  Occupational History  . Not on file  Social Needs  . Financial resource strain: Not on file  . Food insecurity    Worry: Not on file    Inability: Not on file  . Transportation needs    Medical: Not on file    Non-medical: Not on file  Tobacco Use  . Smoking status: Never Smoker  . Smokeless tobacco: Never Used  Substance and Sexual Activity  . Alcohol use: Yes    Alcohol/week: 5.0 standard drinks    Types: 5 Standard drinks or equivalent per week    Comment: mixed drink or a glass of wine  . Drug use: No  . Sexual activity: Not on file  Lifestyle  . Physical activity    Days per week: Not on file    Minutes per  session: Not on file  . Stress: Not on file  Relationships  . Social Herbalist on phone: Not on file    Gets together: Not on file    Attends religious service: Not on file    Active member of club or organization: Not on file    Attends meetings of clubs or organizations: Not on file    Relationship status: Not on file  Other Topics Concern  . Not on file  Social History Narrative  . Not on file    Allergies: Allergies  Allergen Reactions  . Latex Other (See Comments)    Outpatient Meds: Current Outpatient Medications  Medication Sig Dispense Refill  . aspirin 81 MG tablet Take 81 mg by mouth daily.      . calcium-vitamin D (OSCAL WITH  D) 500-200 MG-UNIT per tablet Take 1 tablet by mouth 2 (two) times daily.     Marland Kitchen estradiol (ESTRACE) 2 MG tablet Take 1 mg by mouth daily.     . magnesium hydroxide (MILK OF MAGNESIA) 400 MG/5ML suspension Take 5 mLs by mouth daily as needed.      Marland Kitchen omeprazole (PRILOSEC) 40 MG capsule Take 1 capsule (40 mg total) by mouth daily. 90 capsule 3  . Probiotic Product (PROBIOTIC DAILY PO) Take by mouth.     No current facility-administered medications for this visit.     Takes the PPI about every other day for dyspepsia  ___________________________________________________________________ Objective   Exam:   No exam - virtual visit  Labs:  No recent data  Assessment: Encounter Diagnoses  Name Primary?  . Special screening for malignant neoplasms, colon Yes  . Abdominal bloating     Average risk for colon cancer. Nonspecific bloating - known diverticulosis  Plan:  My office will contact her to schedule a colonoscopy.  Thank you for the courtesy of this consult.  Please call me with any questions or concerns.  Nelida Meuse III  CC: Referring provider noted above

## 2018-08-18 NOTE — Addendum Note (Signed)
Addended by: Nelida Meuse on: 08/18/2018 04:50 PM   Modules accepted: Level of Service

## 2018-08-23 ENCOUNTER — Telehealth: Payer: Self-pay | Admitting: Gastroenterology

## 2018-08-23 NOTE — Telephone Encounter (Signed)
All questions about the prep for her colonoscopy have been answered.

## 2018-08-23 NOTE — Telephone Encounter (Signed)
Patient called said that she has several questions regarding her instructions for her procedure.

## 2018-08-24 ENCOUNTER — Telehealth: Payer: Self-pay | Admitting: *Deleted

## 2018-08-24 NOTE — Telephone Encounter (Signed)
Copied from [place]. Topic: General - Inquiry >> Aug 23, 2018 12:35 PM Rayann Heman wrote: Reason for CRM: pt called and stated that she would like nomogram and bone density results. >> Aug 24, 2018  3:24 PM [place], [place] wrote: Patient called again to get the results of her breast exam and bone density.  Please call patient as soon as the results are in.  CB# [phone]

## 2018-08-25 NOTE — Telephone Encounter (Signed)
Dr. Fry please advise. Thanks  

## 2018-08-26 NOTE — Telephone Encounter (Signed)
According to her chart, we have already notified her of these results. Anyway, both are normal. Repeat mammogram in one year and DEXA in 2 years

## 2018-09-01 ENCOUNTER — Telehealth: Payer: Self-pay | Admitting: *Deleted

## 2018-09-01 NOTE — Telephone Encounter (Signed)
Wanted to discuss her mammogram results.

## 2018-09-01 NOTE — Telephone Encounter (Signed)
Pt is aware of results. 

## 2018-09-05 ENCOUNTER — Encounter: Payer: PPO | Admitting: Family Medicine

## 2018-09-07 NOTE — Telephone Encounter (Signed)
Dr. Sarajane Jews please advise on the mammogram results.  thanks

## 2018-09-07 NOTE — Telephone Encounter (Signed)
Patient called to say that she is still waiting to hear from someone about her mammogram say she have called at least 6 times. Ph# [phone]

## 2018-09-07 NOTE — Telephone Encounter (Signed)
The mammogram was normal and a follow up is recommended in one year

## 2018-09-14 ENCOUNTER — Encounter: Payer: PPO | Admitting: Family Medicine

## 2018-09-19 ENCOUNTER — Telehealth: Payer: Self-pay | Admitting: Gastroenterology

## 2018-09-19 NOTE — Telephone Encounter (Signed)
Pt requested a call back to discuss upcoming procedure.

## 2018-09-19 NOTE — Telephone Encounter (Signed)
Prep questions about clear liquids have been answered.  Pt has a history of constipation and wanted top know if it was ok to still use her MOM the week of her procedure. All questions have been answered

## 2018-09-28 ENCOUNTER — Telehealth: Payer: Self-pay | Admitting: Gastroenterology

## 2018-09-28 NOTE — Telephone Encounter (Signed)

## 2018-09-29 ENCOUNTER — Ambulatory Visit (AMBULATORY_SURGERY_CENTER): Payer: PPO | Admitting: Gastroenterology

## 2018-09-29 ENCOUNTER — Other Ambulatory Visit: Payer: Self-pay

## 2018-09-29 ENCOUNTER — Encounter: Payer: Self-pay | Admitting: Gastroenterology

## 2018-09-29 VITALS — BP 135/70 | HR 75 | Temp 99.1°F | Resp 18 | Ht 66.0 in | Wt 165.0 lb

## 2018-09-29 DIAGNOSIS — D122 Benign neoplasm of ascending colon: Secondary | ICD-10-CM

## 2018-09-29 DIAGNOSIS — Z1211 Encounter for screening for malignant neoplasm of colon: Secondary | ICD-10-CM | POA: Diagnosis not present

## 2018-09-29 DIAGNOSIS — D120 Benign neoplasm of cecum: Secondary | ICD-10-CM

## 2018-09-29 DIAGNOSIS — K635 Polyp of colon: Secondary | ICD-10-CM | POA: Diagnosis not present

## 2018-09-29 HISTORY — PX: COLONOSCOPY: SHX174

## 2018-09-29 MED ORDER — SODIUM CHLORIDE 0.9 % IV SOLN
500.0000 mL | Freq: Once | INTRAVENOUS | Status: DC
Start: 2018-09-29 — End: 2018-09-29

## 2018-09-29 NOTE — Progress Notes (Signed)
To PACU, VSS. Report to [email or web address] 

## 2018-09-29 NOTE — Progress Notes (Signed)
Temp by [place] and vitals by CW

## 2018-09-29 NOTE — Op Note (Signed)
[facility] Patient Name: [name] Procedure Date: 09/29/2018 11:32 AM MRN: [id number] Endoscopist: Mallie Mussel L. Loletha Carrow , MD Age: 73 Referring MD:  Date of Birth: 08/09/1945 Gender: Female Account #: [id number] Procedure:                Colonoscopy Indications:              Screening for colorectal malignant neoplasm (no                            polyps last colonoscopy 11/2007) Medicines:                Monitored Anesthesia Care Procedure:                Pre-Anesthesia Assessment:                           - Prior to the procedure, a History and Physical                            was performed, and patient medications and                            allergies were reviewed. The patient's tolerance of                            previous anesthesia was also reviewed. The risks                            and benefits of the procedure and the sedation                            options and risks were discussed with the patient.                            All questions were answered, and informed consent                            was obtained. Prior Anticoagulants: The patient has                            taken no previous anticoagulant or antiplatelet                            agents except for aspirin. ASA Grade Assessment: II                            - A patient with mild systemic disease. After                            reviewing the risks and benefits, the patient was                            deemed in satisfactory condition to undergo the  procedure.                           After obtaining informed consent, the colonoscope                            was passed under direct vision. Throughout the                            procedure, the patient's blood pressure, pulse, and                            oxygen saturations were monitored continuously. The                            Colonoscope was introduced through the anus and                 advanced to the the cecum, identified by                            appendiceal orifice and ileocecal valve. The                            colonoscopy was somewhat difficult due to multiple                            diverticula in the colon and a tortuous colon.                            Successful completion of the procedure was aided by                            using manual pressure. The patient tolerated the                            procedure well. The quality of the bowel                            preparation was good. The ileocecal valve,                            appendiceal orifice, and rectum were photographed. Scope In: 11:42:09 AM Scope Out: 12:02:59 PM Scope Withdrawal Time: 0 hours 15 minutes 26 seconds  Total Procedure Duration: 0 hours 20 minutes 50 seconds  Findings:                 The perianal and digital rectal examinations were                            normal.                           Multiple diverticula were found in the left colon  and right colon.                           The left colon was tortuous.                           Two sessile polyps were found in the cecum. The                            polyps were 3 to 6 mm in size. These polyps were                            removed with a cold snare. Resection and retrieval                            were complete.                           A 10 mm polyp was found in the proximal ascending                            colon. The polyp was semi-sessile with a mucus cap.                            The polyp was removed with a cold snare. Resection                            and retrieval were complete.                           The exam was otherwise without abnormality on                            direct and retroflexion views. Complications:            No immediate complications. Estimated Blood Loss:     Estimated blood loss was minimal. Impression:                - Diverticulosis in the left colon and in the right                            colon.                           - Tortuous colon.                           - Two 3 to 6 mm polyps in the cecum, removed with a                            cold snare. Resected and retrieved.                           - One 10 mm polyp in the proximal ascending colon,  removed with a cold snare. Resected and retrieved.                           - The examination was otherwise normal on direct                            and retroflexion views. Recommendation:           - Patient has a contact number available for                            emergencies. The signs and symptoms of potential                            delayed complications were discussed with the                            patient. Return to normal activities tomorrow.                            Written discharge instructions were provided to the                            patient.                           - Resume previous diet.                           - Continue present medications.                           - Await pathology results.                           - Based on current guidelines, no repeat                            surveillance colonoscopy is necessary at this age. Henry L. Loletha Carrow, MD 09/29/2018 12:14:26 PM This report has been signed electronically.

## 2018-09-29 NOTE — Progress Notes (Signed)
Called to room to assist during endoscopic procedure.  Patient ID and intended procedure confirmed with present staff. Received instructions for my participation in the procedure from the performing physician.  

## 2018-09-29 NOTE — Patient Instructions (Signed)
YOU HAD AN ENDOSCOPIC PROCEDURE TODAY AT [facility]:   Refer to the procedure report that was given to you for any specific questions about what was found during the examination.  If the procedure report does not answer your questions, please call your gastroenterologist to clarify.  If you requested that your care partner not be given the details of your procedure findings, then the procedure report has been included in a sealed envelope for you to review at your convenience later.  YOU SHOULD EXPECT: Some feelings of bloating in the abdomen. Passage of more gas than usual.  Walking can help get rid of the air that was put into your GI tract during the procedure and reduce the bloating. If you had a lower endoscopy (such as a colonoscopy or flexible sigmoidoscopy) you may notice spotting of blood in your stool or on the toilet paper. If you underwent a bowel prep for your procedure, you may not have a normal bowel movement for a few days.  Please Note:  You might notice some irritation and congestion in your nose or some drainage.  This is from the oxygen used during your procedure.  There is no need for concern and it should clear up in a day or so.  SYMPTOMS TO REPORT IMMEDIATELY:   Following lower endoscopy (colonoscopy or flexible sigmoidoscopy):  Excessive amounts of blood in the stool  Significant tenderness or worsening of abdominal pains  Swelling of the abdomen that is new, acute  Fever of 100F or higher  Please see handouts given to you on Polyps and Diverticulosis.   For urgent or emergent issues, a gastroenterologist can be reached at any hour by calling [phone].   DIET:  We do recommend a small meal at first, but then you may proceed to your regular diet.  Drink plenty of fluids but you should avoid alcoholic beverages for 24 hours.  ACTIVITY:  You should plan to take it easy for the rest of today and you should NOT DRIVE or use heavy machinery until  tomorrow (because of the sedation medicines used during the test).    FOLLOW UP: Our staff will call the number listed on your records 48-72 hours following your procedure to check on you and address any questions or concerns that you may have regarding the information given to you following your procedure. If we do not reach you, we will leave a message.  We will attempt to reach you two times.  During this call, we will ask if you have developed any symptoms of COVID 19. If you develop any symptoms (ie: fever, flu-like symptoms, shortness of breath, cough etc.) before then, please call [phone].  If you test positive for Covid 19 in the 2 weeks post procedure, please call and report this information to [place].    If any biopsies were taken you will be contacted by phone or by letter within the next 1-3 weeks.  Please call us at [phone] if you have not heard about the biopsies in 3 weeks.    SIGNATURES/CONFIDENTIALITY: You and/or your care partner have signed paperwork which will be entered into your electronic medical record.  These signatures attest to the fact that that the information above on your After Visit Summary has been reviewed and is understood.  Full responsibility of the confidentiality of this discharge information lies with you and/or your care-partner.  Thank you for letting us take care of your healthcare needs today.

## 2018-10-03 ENCOUNTER — Telehealth: Payer: Self-pay

## 2018-10-03 ENCOUNTER — Encounter: Payer: Self-pay | Admitting: Gastroenterology

## 2018-10-03 NOTE — Telephone Encounter (Signed)
Covid-19 screening questions   Do you now or have you had a fever in the last 14 days? No.  Do you have any respiratory symptoms of shortness of breath or cough now or in the last 14 days? No.  Do you have any family members or close contacts with diagnosed or suspected Covid-19 in the past 14 days? No.  Have you been tested for Covid-19 and found to be positive? No.       Follow up Call-  Call back number 09/29/2018  Post procedure Call Back phone  # [phone]  Permission to leave phone message Yes  Some recent data might be hidden     Patient questions:  Do you have a fever, pain , or abdominal swelling? No. Pain Score  0 *  Have you tolerated food without any problems? Yes.    Have you been able to return to your normal activities? Yes.    Do you have any questions about your discharge instructions: Diet   No. Medications  No. Follow up visit  No.  Do you have questions or concerns about your Care? No.  Actions: * If pain score is 4 or above: No action needed, pain <4.

## 2018-10-14 ENCOUNTER — Encounter: Payer: Self-pay | Admitting: Family Medicine

## 2018-10-14 ENCOUNTER — Other Ambulatory Visit: Payer: Self-pay

## 2018-10-14 ENCOUNTER — Ambulatory Visit (INDEPENDENT_AMBULATORY_CARE_PROVIDER_SITE_OTHER): Payer: PPO | Admitting: Family Medicine

## 2018-10-14 VITALS — BP 170/100 | HR 93 | Temp 98.0°F | Wt 166.2 lb

## 2018-10-14 DIAGNOSIS — E538 Deficiency of other specified B group vitamins: Secondary | ICD-10-CM | POA: Diagnosis not present

## 2018-10-14 DIAGNOSIS — Z0000 Encounter for general adult medical examination without abnormal findings: Secondary | ICD-10-CM | POA: Diagnosis not present

## 2018-10-14 DIAGNOSIS — M25562 Pain in left knee: Secondary | ICD-10-CM | POA: Diagnosis not present

## 2018-10-14 DIAGNOSIS — R1030 Lower abdominal pain, unspecified: Secondary | ICD-10-CM

## 2018-10-14 DIAGNOSIS — I10 Essential (primary) hypertension: Secondary | ICD-10-CM

## 2018-10-14 DIAGNOSIS — G8929 Other chronic pain: Secondary | ICD-10-CM

## 2018-10-14 DIAGNOSIS — E559 Vitamin D deficiency, unspecified: Secondary | ICD-10-CM | POA: Diagnosis not present

## 2018-10-14 LAB — CBC WITH DIFFERENTIAL/PLATELET
Basophils Absolute: 0 10*3/uL (ref 0.0–0.1)
Basophils Relative: 0.8 % (ref 0.0–3.0)
Eosinophils Absolute: 0.2 10*3/uL (ref 0.0–0.7)
Eosinophils Relative: 3.5 % (ref 0.0–5.0)
HCT: 39.2 % (ref 36.0–46.0)
Hemoglobin: 13.3 g/dL (ref 12.0–15.0)
Lymphocytes Relative: 26.9 % (ref 12.0–46.0)
Lymphs Abs: 1.6 10*3/uL (ref 0.7–4.0)
MCHC: 33.9 g/dL (ref 30.0–36.0)
MCV: 94.2 fl (ref 78.0–100.0)
Monocytes Absolute: 0.4 10*3/uL (ref 0.1–1.0)
Monocytes Relative: 7.2 % (ref 3.0–12.0)
Neutro Abs: 3.6 10*3/uL (ref 1.4–7.7)
Neutrophils Relative %: 61.6 % (ref 43.0–77.0)
Platelets: 269 10*3/uL (ref 150.0–400.0)
RBC: 4.16 Mil/uL (ref 3.87–5.11)
RDW: 12.9 % (ref 11.5–15.5)
WBC: 5.9 10*3/uL (ref 4.0–10.5)

## 2018-10-14 LAB — LIPID PANEL
Cholesterol: 228 mg/dL — ABNORMAL HIGH (ref 0–200)
HDL: 60.8 mg/dL (ref >39.00)
LDL Cholesterol: 131 mg/dL — ABNORMAL HIGH (ref 0–99)
NonHDL: 167.25
Total CHOL/HDL Ratio: 4
Triglycerides: 179 mg/dL — ABNORMAL HIGH (ref 0.0–149.0)
VLDL: 35.8 mg/dL (ref 0.0–40.0)

## 2018-10-14 LAB — HEPATIC FUNCTION PANEL
ALT: 21 U/L (ref 0–35)
AST: 18 U/L (ref 0–37)
Albumin: 4.3 g/dL (ref 3.5–5.2)
Alkaline Phosphatase: 82 U/L (ref 39–117)
Bilirubin, Direct: 0.1 mg/dL (ref 0.0–0.3)
Total Bilirubin: 0.6 mg/dL (ref 0.2–1.2)
Total Protein: 6.9 g/dL (ref 6.0–8.3)

## 2018-10-14 LAB — BASIC METABOLIC PANEL
BUN: 15 mg/dL (ref 6–23)
CO2: 30 mEq/L (ref 19–32)
Calcium: 9.2 mg/dL (ref 8.4–10.5)
Chloride: 102 mEq/L (ref 96–112)
Creatinine, Ser: 0.69 mg/dL (ref 0.40–1.20)
GFR: 82.88 mL/min (ref >60.00)
Glucose, Bld: 94 mg/dL (ref 70–99)
Potassium: 4.4 mEq/L (ref 3.5–5.1)
Sodium: 138 mEq/L (ref 135–145)

## 2018-10-14 LAB — VITAMIN D 25 HYDROXY (VIT D DEFICIENCY, FRACTURES): VITD: 46.83 ng/mL (ref 30.00–100.00)

## 2018-10-14 LAB — TSH: TSH: 2.44 u[IU]/mL (ref 0.35–4.50)

## 2018-10-14 LAB — VITAMIN B12: Vitamin B-12: 299 pg/mL (ref 211–911)

## 2018-10-14 MED ORDER — OMEPRAZOLE 40 MG PO CPDR: 40.0000 mg | DELAYED_RELEASE_CAPSULE | Freq: Every day | ORAL | 3 refills | Status: DC

## 2018-10-14 MED ORDER — LOSARTAN POTASSIUM 50 MG PO TABS: 50.0000 mg | ORAL_TABLET | Freq: Every day | ORAL | 3 refills | Status: DC

## 2018-10-14 NOTE — Progress Notes (Signed)
Subjective:    Patient ID: [name], female    DOB: 1945/04/08, 73 y.o.   MRN: [id number]  HPI Here for a well exam and for several issues. For the past year she had had a lot of abdominal bloating and now for the past month she has also had mild pains across the lower abdomen. No fever, no nausea or vomiting. Her BMs are normal. No urinary symptoms. She recently had a colonoscopy that was normal except for a few polyps. Also she has arthritis in both knees, and the left knee has been giving her more lain lately. Finally her BP at home has been running high for the past 2 months, averaging 160-170 / 90-100. No chest pain or SOB.    Review of Systems  Constitutional: Negative.   HENT: Negative.   Eyes: Negative.   Respiratory: Negative.   Cardiovascular: Negative.   Gastrointestinal: Positive for abdominal distention and abdominal pain. Negative for anal bleeding, blood in stool, constipation, diarrhea, nausea, rectal pain and vomiting.  Genitourinary: Negative for decreased urine volume, difficulty urinating, dyspareunia, dysuria, enuresis, flank pain, frequency, hematuria, pelvic pain and urgency.  Musculoskeletal: Positive for arthralgias.  Skin: Negative.   Neurological: Negative.   Psychiatric/Behavioral: Negative.        Objective:   Physical Exam Constitutional:      General: She is not in acute distress.    Appearance: She is well-developed.  HENT:     Head: Normocephalic and atraumatic.     Right Ear: External ear normal.     Left Ear: External ear normal.     Nose: Nose normal.     Mouth/Throat:     Pharynx: No oropharyngeal exudate.  Eyes:     General: No scleral icterus.    Conjunctiva/sclera: Conjunctivae normal.     Pupils: Pupils are equal, round, and reactive to light.  Neck:     Musculoskeletal: Normal range of motion and neck supple.     Thyroid: No thyromegaly.     Vascular: No JVD.  Cardiovascular:     Rate and Rhythm: Normal rate and regular  rhythm.     Heart sounds: Normal heart sounds. No murmur. No friction rub. No gallop.   Pulmonary:     Effort: Pulmonary effort is normal. No respiratory distress.     Breath sounds: Normal breath sounds. No wheezing or rales.  Chest:     Chest wall: No tenderness.  Abdominal:     General: Bowel sounds are normal. There is no distension.     Palpations: Abdomen is soft. There is no mass.     Tenderness: There is no abdominal tenderness. There is no guarding or rebound.  Musculoskeletal: Normal range of motion.        General: No tenderness.  Lymphadenopathy:     Cervical: No cervical adenopathy.  Skin:    General: Skin is warm and dry.     Findings: No erythema or rash.  Neurological:     Mental Status: She is alert and oriented to person, place, and time.     Cranial Nerves: No cranial nerve deficit.     Motor: No abnormal muscle tone.     Coordination: Coordination normal.     Deep Tendon Reflexes: Reflexes are normal and symmetric. Reflexes normal.  Psychiatric:        Behavior: Behavior normal.        Thought Content: Thought content normal.        Judgment: Judgment normal.  Assessment & Plan:  Well exam. We discussed diet and exercise. Get fasting labs. For the HTN she will start on Losartan 50 mg daily. She will follow this at home and follow up with [place] in 3-4 weeks. For the arthritis in the knees, we will refer her to Orthopedics. For the lower abdominal pains we will set up a CT scan of the abdomen and pelvis.  Alysia Penna, MD

## 2018-10-20 ENCOUNTER — Telehealth: Payer: Self-pay

## 2018-10-20 DIAGNOSIS — M25562 Pain in left knee: Secondary | ICD-10-CM | POA: Diagnosis not present

## 2018-10-20 NOTE — Telephone Encounter (Signed)
Please advise. Order was placed as CT abdomen Pelvis with contrast.  Is this correct? Patient believes it was supposed to be IV

## 2018-10-20 NOTE — Telephone Encounter (Signed)
This includes both oral and IV contrast

## 2018-10-20 NOTE — Telephone Encounter (Signed)
Copied from [facility] [id number]. Topic: General - Other >> Oct 19, 2018  5:16 PM [name], [name] wrote: Reason for CRM: Pt stated she has some questions regarding what was ordered for the CT scan. Pt stated it was suppose to be iv but when she was contacted she was told she had to drink something. Pt requests call back.

## 2018-10-20 NOTE — Telephone Encounter (Signed)
Patient is aware 

## 2018-10-21 ENCOUNTER — Telehealth: Payer: Self-pay

## 2018-10-21 MED ORDER — ZOSTER VAC RECOMB ADJUVANTED 50 MCG/0.5ML IM SUSR: 0.5000 mL | Freq: Once | INTRAMUSCULAR | 1 refills | Status: DC

## 2018-10-21 MED ORDER — ZOSTER VAC RECOMB ADJUVANTED 50 MCG/0.5ML IM SUSR: 0.5000 mL | Freq: Once | INTRAMUSCULAR | 1 refills | Status: AC

## 2018-10-21 NOTE — Telephone Encounter (Signed)
Copied from [facility] [id number]. Topic: General - Other >> Oct 20, 2018  1:01 PM Burchel, Abbi R wrote: Reason for CRM: Pt state she received a call re: shingles vaccine  rx being ready for her.  Pt has no way to come get it.  Please mail to home address.

## 2018-10-21 NOTE — Telephone Encounter (Signed)
Rx was sent in electronically. Patient is aware.

## 2018-10-24 ENCOUNTER — Telehealth: Payer: Self-pay

## 2018-10-24 NOTE — Telephone Encounter (Signed)
Information given. nothing further needed.

## 2018-10-24 NOTE — Telephone Encounter (Signed)
Copied from [place] [phone]. Topic: General - Other >> Oct 24, 2018 10:39 AM Celene Kras A wrote: Reason for CRM: Pt called and is requesting to know if she had a kidney function lab, GFR, done on her most recent set of labs and is wanting to know her cholesterol levels. Please advise.

## 2018-10-27 ENCOUNTER — Other Ambulatory Visit: Payer: Self-pay

## 2018-10-27 ENCOUNTER — Ambulatory Visit
Admission: RE | Admit: 2018-10-27 | Discharge: 2018-10-27 | Disposition: A | Discharge status: Home / Self Care | Payer: PPO | Source: Ambulatory Visit | Attending: Family Medicine | Admitting: Family Medicine

## 2018-10-27 DIAGNOSIS — R1030 Lower abdominal pain, unspecified: Secondary | ICD-10-CM

## 2018-10-27 DIAGNOSIS — K5730 Diverticulosis of large intestine without perforation or abscess without bleeding: Secondary | ICD-10-CM | POA: Diagnosis not present

## 2018-10-27 MED ORDER — IOPAMIDOL (ISOVUE-300) INJECTION 61%
100.0000 mL | Freq: Once | INTRAVENOUS | Status: AC | PRN
  Administered 2018-10-27: 10:00:00 100 mL via INTRAVENOUS

## 2018-10-27 NOTE — Addendum Note (Signed)
Addended by: Alysia Penna A on: 10/27/2018 01:12 PM   Modules accepted: Orders

## 2018-10-28 ENCOUNTER — Telehealth: Payer: Self-pay

## 2018-10-28 NOTE — Telephone Encounter (Signed)
Lab appointment made. Needed for referral.

## 2018-10-31 ENCOUNTER — Other Ambulatory Visit: Payer: PPO

## 2018-11-01 ENCOUNTER — Other Ambulatory Visit: Payer: PPO

## 2018-11-01 ENCOUNTER — Other Ambulatory Visit (INDEPENDENT_AMBULATORY_CARE_PROVIDER_SITE_OTHER): Payer: PPO

## 2018-11-01 ENCOUNTER — Other Ambulatory Visit: Payer: Self-pay

## 2018-11-01 DIAGNOSIS — I10 Essential (primary) hypertension: Secondary | ICD-10-CM

## 2018-11-02 LAB — BASIC METABOLIC PANEL
BUN: 21 mg/dL (ref 6–23)
CO2: 34 mEq/L — ABNORMAL HIGH (ref 19–32)
Calcium: 9.2 mg/dL (ref 8.4–10.5)
Chloride: 100 mEq/L (ref 96–112)
Creatinine, Ser: 0.91 mg/dL (ref 0.40–1.20)
GFR: 60.21 mL/min (ref >60.00)
Glucose, Bld: 98 mg/dL (ref 70–99)
Potassium: 3.9 mEq/L (ref 3.5–5.1)
Sodium: 141 mEq/L (ref 135–145)

## 2018-11-02 NOTE — Telephone Encounter (Signed)
Wells Guiles with [facility] stated that pt needs to have BUN and creatinine labs done prior to her MRI scheduled for 11/08/18. Requesting callback CB#[phone]. Order was faxed on 10/24/18

## 2018-11-02 NOTE — Telephone Encounter (Signed)
Labs have been faxed.

## 2018-11-02 NOTE — Telephone Encounter (Signed)
Spoke with rebecca labs need to be faxed to [phone].

## 2018-11-14 DIAGNOSIS — M25562 Pain in left knee: Secondary | ICD-10-CM | POA: Diagnosis not present

## 2018-12-08 DIAGNOSIS — S83242A Other tear of medial meniscus, current injury, left knee, initial encounter: Secondary | ICD-10-CM | POA: Diagnosis not present

## 2018-12-08 DIAGNOSIS — M25562 Pain in left knee: Secondary | ICD-10-CM | POA: Diagnosis not present

## 2019-01-03 ENCOUNTER — Encounter: Payer: Self-pay | Admitting: Family Medicine

## 2019-01-03 ENCOUNTER — Other Ambulatory Visit: Payer: Self-pay

## 2019-01-03 ENCOUNTER — Ambulatory Visit (INDEPENDENT_AMBULATORY_CARE_PROVIDER_SITE_OTHER): Payer: PPO | Admitting: Family Medicine

## 2019-01-03 VITALS — BP 146/86 | HR 71 | Temp 98.2°F | Ht 66.0 in | Wt 167.0 lb

## 2019-01-03 DIAGNOSIS — M25562 Pain in left knee: Secondary | ICD-10-CM | POA: Diagnosis not present

## 2019-01-03 DIAGNOSIS — G8929 Other chronic pain: Secondary | ICD-10-CM

## 2019-01-03 DIAGNOSIS — Z01818 Encounter for other preprocedural examination: Secondary | ICD-10-CM

## 2019-01-03 NOTE — Progress Notes (Signed)
Disregard

## 2019-01-03 NOTE — Patient Instructions (Signed)
Health Maintenance Due  Topic Date Due  . PNA vac Low Risk Adult (2 of 2 - PPSV23) 03/29/2014  . INFLUENZA VACCINE  10/01/2018    Depression screen [facility] 2/9 05/04/2017 04/27/2016 04/10/2015  Decreased Interest 0 0 0  Down, Depressed, Hopeless 0 0 0  PHQ - 2 Score 0 0 0

## 2019-01-03 NOTE — Progress Notes (Signed)
   Subjective:    Patient ID: [name], female    DOB: 26-Feb-1946, 73 y.o.   MRN: [id number]  HPI Here for preoperative evaluation. She is scheduled for a left knee arthroscopy per Dr. Frederik Pear on 01-13-19 . Other than knee pain she feels well. She had a general wellness exam in August with complete lab work, and this came out fine.    Review of Systems  Constitutional: Negative.   Respiratory: Negative.   Cardiovascular: Negative.   Gastrointestinal: Negative.   Genitourinary: Negative.   Musculoskeletal: Positive for arthralgias.  Neurological: Negative.        Objective:   Physical Exam Constitutional:      Appearance: Normal appearance.  Cardiovascular:     Rate and Rhythm: Normal rate and regular rhythm.     Pulses: Normal pulses.     Heart sounds: Normal heart sounds.     Comments: EKG today is normal  Pulmonary:     Effort: Pulmonary effort is normal.     Breath sounds: Normal breath sounds.  Abdominal:     General: Abdomen is flat. Bowel sounds are normal. There is no distension.     Palpations: Abdomen is soft. There is no mass.     Tenderness: There is no abdominal tenderness. There is no guarding or rebound.     Hernia: No hernia is present.  Neurological:     General: No focal deficit present.     Mental Status: She is alert and oriented to person, place, and time.           Assessment & Plan:  Preoperative evaluation. She is doing well and she is cleared for the surgery as above.  Lauren Penna, MD

## 2019-01-13 DIAGNOSIS — X58XXXA Exposure to other specified factors, initial encounter: Secondary | ICD-10-CM | POA: Diagnosis not present

## 2019-01-13 DIAGNOSIS — D2122 Benign neoplasm of connective and other soft tissue of left lower limb, including hip: Secondary | ICD-10-CM | POA: Diagnosis not present

## 2019-01-13 DIAGNOSIS — S83242A Other tear of medial meniscus, current injury, left knee, initial encounter: Secondary | ICD-10-CM | POA: Diagnosis not present

## 2019-01-13 DIAGNOSIS — M67462 Ganglion, left knee: Secondary | ICD-10-CM | POA: Diagnosis not present

## 2019-01-13 DIAGNOSIS — S83282A Other tear of lateral meniscus, current injury, left knee, initial encounter: Secondary | ICD-10-CM | POA: Diagnosis not present

## 2019-01-13 DIAGNOSIS — M94262 Chondromalacia, left knee: Secondary | ICD-10-CM | POA: Diagnosis not present

## 2019-01-13 DIAGNOSIS — Y999 Unspecified external cause status: Secondary | ICD-10-CM | POA: Diagnosis not present

## 2019-01-13 HISTORY — PX: KNEE SURGERY: SHX244

## 2019-01-20 ENCOUNTER — Telehealth: Payer: Self-pay | Admitting: Family Medicine

## 2019-01-20 NOTE — Telephone Encounter (Signed)
Pt and insurance called in for assistance. Pt received a bill for a CPE on 10/14/18, pt says that she came in for a regular office visit and not cpe. Pt would like to have this updated with her insurance.    Please assist.

## 2019-01-24 DIAGNOSIS — M25562 Pain in left knee: Secondary | ICD-10-CM | POA: Diagnosis not present

## 2019-01-24 NOTE — Telephone Encounter (Signed)
Pt did have a CPE on this date. Will call pt to inform.

## 2019-01-25 ENCOUNTER — Other Ambulatory Visit: Payer: Self-pay

## 2019-01-30 NOTE — Telephone Encounter (Signed)
Left message to return phone call.

## 2019-02-06 NOTE — Telephone Encounter (Signed)
Spoke to pt and advised that a physical was performed on date. Pt stated that she was seen for knee pain. I advised pt that she had a CPE and knee pain as well as other issues were addressed. Pt stated " that's fine I don't care how you guys list it I'm not paying for anything to see my PCP". No further action needed.

## 2019-02-07 DIAGNOSIS — M25562 Pain in left knee: Secondary | ICD-10-CM | POA: Diagnosis not present

## 2019-03-09 DIAGNOSIS — Z9889 Other specified postprocedural states: Secondary | ICD-10-CM | POA: Diagnosis not present

## 2019-03-23 DIAGNOSIS — H40013 Open angle with borderline findings, low risk, bilateral: Secondary | ICD-10-CM | POA: Diagnosis not present

## 2019-03-23 DIAGNOSIS — H524 Presbyopia: Secondary | ICD-10-CM | POA: Diagnosis not present

## 2019-03-23 DIAGNOSIS — H5203 Hypermetropia, bilateral: Secondary | ICD-10-CM | POA: Diagnosis not present

## 2019-03-23 DIAGNOSIS — H2513 Age-related nuclear cataract, bilateral: Secondary | ICD-10-CM | POA: Diagnosis not present

## 2019-04-06 DIAGNOSIS — Z9889 Other specified postprocedural states: Secondary | ICD-10-CM | POA: Diagnosis not present

## 2019-05-12 DIAGNOSIS — K219 Gastro-esophageal reflux disease without esophagitis: Secondary | ICD-10-CM | POA: Diagnosis not present

## 2019-05-12 DIAGNOSIS — J3800 Paralysis of vocal cords and larynx, unspecified: Secondary | ICD-10-CM | POA: Diagnosis not present

## 2019-05-12 DIAGNOSIS — J383 Other diseases of vocal cords: Secondary | ICD-10-CM | POA: Diagnosis not present

## 2019-07-07 ENCOUNTER — Other Ambulatory Visit: Payer: Self-pay | Admitting: Family Medicine

## 2019-07-07 DIAGNOSIS — Z1231 Encounter for screening mammogram for malignant neoplasm of breast: Secondary | ICD-10-CM

## 2019-09-01 ENCOUNTER — Other Ambulatory Visit: Payer: Self-pay

## 2019-09-01 ENCOUNTER — Ambulatory Visit
Admission: RE | Admit: 2019-09-01 | Discharge: 2019-09-01 | Disposition: A | Discharge status: Home / Self Care | Payer: PPO | Source: Ambulatory Visit | Attending: Family Medicine | Admitting: Family Medicine

## 2019-09-01 DIAGNOSIS — Z1231 Encounter for screening mammogram for malignant neoplasm of breast: Secondary | ICD-10-CM | POA: Diagnosis not present

## 2019-09-06 ENCOUNTER — Other Ambulatory Visit: Payer: Self-pay | Admitting: Family Medicine

## 2019-09-06 DIAGNOSIS — R928 Other abnormal and inconclusive findings on diagnostic imaging of breast: Secondary | ICD-10-CM

## 2019-09-19 DIAGNOSIS — H40013 Open angle with borderline findings, low risk, bilateral: Secondary | ICD-10-CM | POA: Diagnosis not present

## 2019-09-19 DIAGNOSIS — H2513 Age-related nuclear cataract, bilateral: Secondary | ICD-10-CM | POA: Diagnosis not present

## 2019-09-28 ENCOUNTER — Other Ambulatory Visit: Payer: Self-pay | Admitting: Family Medicine

## 2019-10-03 ENCOUNTER — Ambulatory Visit
Admission: RE | Admit: 2019-10-03 | Discharge: 2019-10-03 | Disposition: A | Discharge status: Home / Self Care | Payer: PPO | Source: Ambulatory Visit | Attending: Family Medicine | Admitting: Family Medicine

## 2019-10-03 ENCOUNTER — Ambulatory Visit: Payer: PPO

## 2019-10-03 ENCOUNTER — Other Ambulatory Visit: Payer: Self-pay

## 2019-10-03 DIAGNOSIS — R928 Other abnormal and inconclusive findings on diagnostic imaging of breast: Secondary | ICD-10-CM

## 2019-10-17 ENCOUNTER — Encounter: Payer: PPO | Admitting: Family Medicine

## 2019-11-16 ENCOUNTER — Other Ambulatory Visit: Payer: Self-pay

## 2019-11-17 ENCOUNTER — Ambulatory Visit (INDEPENDENT_AMBULATORY_CARE_PROVIDER_SITE_OTHER): Payer: PPO | Admitting: Family Medicine

## 2019-11-17 ENCOUNTER — Encounter: Payer: Self-pay | Admitting: Family Medicine

## 2019-11-17 VITALS — BP 140/88 | HR 87 | Temp 98.7°F | Ht 65.5 in | Wt 162.6 lb

## 2019-11-17 DIAGNOSIS — Z0000 Encounter for general adult medical examination without abnormal findings: Secondary | ICD-10-CM | POA: Diagnosis not present

## 2019-11-17 NOTE — Progress Notes (Signed)
   Subjective:    Patient ID: [name], female    DOB: [birth year], 74 y.o.   MRN: [id number]  HPI Here for a well exam. She feels fairly well.    Review of Systems  Constitutional: Negative.   HENT: Negative.   Eyes: Negative.   Respiratory: Negative.   Cardiovascular: Negative.   Gastrointestinal: Negative.   Genitourinary: Negative for decreased urine volume, difficulty urinating, dyspareunia, dysuria, enuresis, flank pain, frequency, hematuria, pelvic pain and urgency.  Musculoskeletal: Negative.   Skin: Negative.   Neurological: Negative.   Psychiatric/Behavioral: Negative.        Objective:   Physical Exam Constitutional:      General: She is not in acute distress.    Appearance: She is well-developed.  HENT:     Head: Normocephalic and atraumatic.     Right Ear: External ear normal.     Left Ear: External ear normal.     Nose: Nose normal.     Mouth/Throat:     Pharynx: No oropharyngeal exudate.  Eyes:     General: No scleral icterus.    Conjunctiva/sclera: Conjunctivae normal.     Pupils: Pupils are equal, round, and reactive to light.  Neck:     Thyroid: No thyromegaly.     Vascular: No JVD.  Cardiovascular:     Rate and Rhythm: Normal rate and regular rhythm.     Heart sounds: Normal heart sounds. No murmur heard.  No friction rub. No gallop.   Pulmonary:     Effort: Pulmonary effort is normal. No respiratory distress.     Breath sounds: Normal breath sounds. No wheezing or rales.  Chest:     Chest wall: No tenderness.  Abdominal:     General: Bowel sounds are normal. There is no distension.     Palpations: Abdomen is soft. There is no mass.     Tenderness: There is no abdominal tenderness. There is no guarding or rebound.  Musculoskeletal:        General: No tenderness. Normal range of motion.     Cervical back: Normal range of motion and neck supple.  Lymphadenopathy:     Cervical: No cervical adenopathy.  Skin:    General: Skin is warm and  dry.     Findings: No erythema or rash.  Neurological:     Mental Status: She is alert and oriented to person, place, and time.     Cranial Nerves: No cranial nerve deficit.     Motor: No abnormal muscle tone.     Coordination: Coordination normal.     Deep Tendon Reflexes: Reflexes are normal and symmetric. Reflexes normal.  Psychiatric:        Behavior: Behavior normal.        Thought Content: Thought content normal.        Judgment: Judgment normal.           Assessment & Plan:  Well exam. We discussed diet and exercise. Get fasting labs.  Alysia Penna, MD

## 2019-11-17 NOTE — Progress Notes (Signed)
   Subjective:    Patient ID: [name], female    DOB: Feb 17, 1946, 74 y.o.   MRN: [id number]  HPI    Review of Systems     Objective:   Physical Exam        Assessment & Plan:

## 2019-11-18 LAB — HEPATIC FUNCTION PANEL
AG Ratio: 1.5 (calc) (ref 1.0–2.5)
ALT: 13 U/L (ref 6–29)
AST: 15 U/L (ref 10–35)
Albumin: 4.3 g/dL (ref 3.6–5.1)
Alkaline phosphatase (APISO): 98 U/L (ref 37–153)
Bilirubin, Direct: 0.1 mg/dL (ref 0.0–0.2)
Globulin: 2.8 g/dL (calc) (ref 1.9–3.7)
Indirect Bilirubin: 0.6 mg/dL (calc) (ref 0.2–1.2)
Total Bilirubin: 0.7 mg/dL (ref 0.2–1.2)
Total Protein: 7.1 g/dL (ref 6.1–8.1)

## 2019-11-18 LAB — TSH: TSH: 2.92 mIU/L (ref 0.40–4.50)

## 2019-11-18 LAB — BASIC METABOLIC PANEL
BUN: 15 mg/dL (ref 7–25)
CO2: 29 mmol/L (ref 20–32)
Calcium: 9.3 mg/dL (ref 8.6–10.4)
Chloride: 102 mmol/L (ref 98–110)
Creat: 0.7 mg/dL (ref 0.60–0.93)
Glucose, Bld: 91 mg/dL (ref 65–99)
Potassium: 4.4 mmol/L (ref 3.5–5.3)
Sodium: 138 mmol/L (ref 135–146)

## 2019-11-18 LAB — CBC WITH DIFFERENTIAL/PLATELET
Absolute Monocytes: 431 cells/uL (ref 200–950)
Basophils Absolute: 59 cells/uL (ref 0–200)
Basophils Relative: 1 %
Eosinophils Absolute: 218 cells/uL (ref 15–500)
Eosinophils Relative: 3.7 %
HCT: 39.6 % (ref 35.0–45.0)
Hemoglobin: 13.2 g/dL (ref 11.7–15.5)
Lymphs Abs: 1800 cells/uL (ref 850–3900)
MCH: 31.7 pg (ref 27.0–33.0)
MCHC: 33.3 g/dL (ref 32.0–36.0)
MCV: 95.2 fL (ref 80.0–100.0)
MPV: 10.4 fL (ref 7.5–12.5)
Monocytes Relative: 7.3 %
Neutro Abs: 3393 cells/uL (ref 1500–7800)
Neutrophils Relative %: 57.5 %
Platelets: 259 10*3/uL (ref 140–400)
RBC: 4.16 10*6/uL (ref 3.80–5.10)
RDW: 12.5 % (ref 11.0–15.0)
Total Lymphocyte: 30.5 %
WBC: 5.9 10*3/uL (ref 3.8–10.8)

## 2019-11-18 LAB — LIPID PANEL
Cholesterol: 227 mg/dL — ABNORMAL HIGH (ref <200)
HDL: 65 mg/dL (ref >=50)
LDL Cholesterol (Calc): 131 mg/dL (calc) — ABNORMAL HIGH
Non-HDL Cholesterol (Calc): 162 mg/dL (calc) — ABNORMAL HIGH (ref <130)
Total CHOL/HDL Ratio: 3.5 (calc) (ref <5.0)
Triglycerides: 175 mg/dL — ABNORMAL HIGH (ref <150)

## 2019-12-13 ENCOUNTER — Telehealth: Payer: Self-pay | Admitting: Family Medicine

## 2019-12-13 NOTE — Telephone Encounter (Signed)
Left message for patient to schedule Annual Wellness Visit.  Please schedule with Nurse Health Advisor Shannon Crews, RN at [facility]  

## 2020-03-21 DIAGNOSIS — Z6826 Body mass index (BMI) 26.0-26.9, adult: Secondary | ICD-10-CM | POA: Diagnosis not present

## 2020-03-21 DIAGNOSIS — Z01419 Encounter for gynecological examination (general) (routine) without abnormal findings: Secondary | ICD-10-CM | POA: Diagnosis not present

## 2020-04-08 NOTE — Progress Notes (Signed)
Subjective:   [name] is a 75 y.o. female who presents for an Initial Medicare Annual Wellness Visit.  I connected with [name]  today by telephone and verified that I am speaking with the correct person using two identifiers. Location patient: home Location provider: work Persons participating in the virtual visit: patient, provider.   I discussed the limitations, risks, security and privacy concerns of performing an evaluation and management service by telephone and the availability of in person appointments. I also discussed with the patient that there may be a patient responsible charge related to this service. The patient expressed understanding and verbally consented to this telephonic visit.    Interactive audio and video telecommunications were attempted between this provider and patient, however failed, due to patient having technical difficulties OR patient did not have access to video capability.  We continued and completed visit with audio only.      Review of Systems    N/A  Cardiac Risk Factors include: dyslipidemia;hypertension;advanced age (>21men, >16 women)     Objective:    Today's Vitals   There is no height or weight on file to calculate BMI.  Advanced Directives 04/09/2020 07/07/2017  Does Patient Have a Medical Advance Directive? Yes No  Type of Estate agent of [place];Living will -  Copy of Healthcare Power of Attorney in Chart? No - copy requested -  Would patient like information on creating a medical advance directive? - No - Patient declined    Current Medications (verified) Outpatient Encounter Medications as of 04/09/2020  Medication Sig  . aspirin 81 MG tablet Take 81 mg by mouth daily.  . calcium-vitamin D (OSCAL WITH D) 500-200 MG-UNIT per tablet Take 1 tablet by mouth 2 (two) times daily.  Marland Kitchen estradiol (ESTRACE) 2 MG tablet Take 1 mg by mouth daily.   Marland Kitchen losartan (COZAAR) 50 MG tablet TAKE 1 TABLET(50 MG) BY  MOUTH DAILY  . magnesium hydroxide (MILK OF MAGNESIA) 400 MG/5ML suspension Take 5 mLs by mouth daily as needed.  Marland Kitchen omeprazole (PRILOSEC) 40 MG capsule Take 1 capsule (40 mg total) by mouth daily.  . Probiotic Product (PROBIOTIC DAILY PO) Take by mouth. (Patient not taking: Reported on 04/09/2020)   No facility-administered encounter medications on file as of 04/09/2020.    Allergies (verified) Latex   History: Past Medical History:  Diagnosis Date  . GERD (gastroesophageal reflux disease)   . Gynecological examination    sees Dr. Tonny Bollman in [place] for GYN exams  . Hyperlipidemia   . Hypertension    Past Surgical History:  Procedure Laterality Date  . ABDOMINAL HYSTERECTOMY    . COLONOSCOPY  09/29/2018   per Dr. Myrtie Neither, adenomatous polyps, no repeats needed due to age   . FACELIFT    . KNEE SURGERY Left 01/13/2019  . TONSILLECTOMY     Family History  Problem Relation Age of Onset  . Arthritis Other   . Heart disease Other   . Breast cancer Maternal Aunt   . Breast cancer Maternal Grandmother   . Colon cancer Neg Hx   . Stomach cancer Neg Hx   . Pancreatic cancer Neg Hx   . Esophageal cancer Neg Hx    Social History   Socioeconomic History  . Marital status: Single    Spouse name: Not on file  . Number of children: Not on file  . Years of education: Not on file  . Highest education level: Not on file  Occupational History  .  Not on file  Tobacco Use  . Smoking status: Never Smoker  . Smokeless tobacco: Never Used  Vaping Use  . Vaping Use: Never used  Substance and Sexual Activity  . Alcohol use: Yes    Alcohol/week: 1.0 standard drink    Types: 1 Standard drinks or equivalent per week    Comment: mixed drink or a glass of wine  . Drug use: No  . Sexual activity: Not on file  Other Topics Concern  . Not on file  Social History Narrative  . Not on file   Social Determinants of Health   Financial Resource Strain: Low Risk   . Difficulty of Paying  Living Expenses: Not hard at all  Food Insecurity: No Food Insecurity  . Worried About Programme researcher, broadcasting/film/video in the Last Year: Never true  . Ran Out of Food in the Last Year: Never true  Transportation Needs: No Transportation Needs  . Lack of Transportation (Medical): No  . Lack of Transportation (Non-Medical): No  Physical Activity: Inactive  . Days of Exercise per Week: 0 days  . Minutes of Exercise per Session: 0 min  Stress: No Stress Concern Present  . Feeling of Stress : Not at all  Social Connections: Socially Isolated  . Frequency of Communication with Friends and Family: More than three times a week  . Frequency of Social Gatherings with Friends and Family: Once a week  . Attends Religious Services: Never  . Active Member of Clubs or Organizations: No  . Attends Banker Meetings: Never  . Marital Status: Never married    Tobacco Counseling Counseling given: Not Answered   Clinical Intake:  Pre-visit preparation completed: Yes  Pain : No/denies pain     Nutritional Risks: None Diabetes: No  How often do you need to have someone help you when you read instructions, pamphlets, or other written materials from your doctor or pharmacy?: 1 - Never What is the last grade level you completed in school?: 3 years of College  Diabetic? No   Interpreter Needed?: No  Information entered by :: SCrews,LPN   Activities of Daily Living In your present state of health, do you have any difficulty performing the following activities: 04/09/2020  Hearing? N  Vision? N  Difficulty concentrating or making decisions? N  Walking or climbing stairs? N  Dressing or bathing? N  Doing errands, shopping? N  Comment Does not drive but does walk to stores to run errands  Preparing Food and eating ? N  Using the Toilet? N  In the past six months, have you accidently leaked urine? N  Do you have problems with loss of bowel control? N  Managing your Medications? N  Managing  your Finances? N  Housekeeping or managing your Housekeeping? N  Some recent data might be hidden    Patient Care Team: Nelwyn Salisbury, MD as PCP - General  Indicate any recent Medical Services you may have received from other than [facility] providers in the past year (date may be approximate).     Assessment:   This is a routine wellness examination for [name].  Hearing/Vision screen  Hearing Screening   125Hz  250Hz  500Hz  1000Hz  2000Hz  3000Hz  4000Hz  6000Hz  8000Hz   Right ear:           Left ear:           Vision Screening Comments: Patient states gets eyes examined yearly. Wears reading glasses    Dietary issues and exercise activities discussed:  Current Exercise Habits: The patient does not participate in regular exercise at present  Goals    . Patient Stated     I would like to move to a smaller apartment.      Depression Screen PHQ 2/9 Scores 04/09/2020 11/17/2019 05/04/2017 04/27/2016 04/10/2015 04/02/2014  PHQ - 2 Score 0 0 0 0 0 0  PHQ- 9 Score - 0 - - - -  Exception Documentation - Medical reason - - - -    Fall Risk Fall Risk  04/09/2020 11/17/2019 01/25/2019 05/04/2017 04/27/2016  Falls in the past year? 0 0 0 No No  Comment - - Emmi Telephone Survey: data to providers prior to load - -  Number falls in past yr: 0 0 - - -  Injury with Fall? 0 0 - - -  Risk for fall due to : No Fall Risks - - - -  Follow up Falls evaluation completed;Falls prevention discussed - - - -    FALL RISK PREVENTION PERTAINING TO THE HOME:  Any stairs in or around the home? No  If so, are there any without handrails? No  Home free of loose throw rugs in walkways, pet beds, electrical cords, etc? Yes  Adequate lighting in your home to reduce risk of falls? Yes   ASSISTIVE DEVICES UTILIZED TO PREVENT FALLS:  Life alert? No  Use of a cane, walker or w/c? No  Grab bars in the bathroom? No  Shower chair or bench in shower? No  Elevated toilet seat or a handicapped toilet? No   Cognitive Function:      Normal cognitive status assessed by direct observation by this Nurse Health Advisor. No abnormalities found.      Immunizations Immunization History  Administered Date(s) Administered  . Fluad Quad(high Dose 65+) 12/18/2018  . Influenza Split 12/15/2010, 12/18/2011  . Influenza Whole 01/17/2007, 12/12/2009  . Influenza, High Dose Seasonal PF 12/20/2014, 12/27/2015, 12/02/2016  . Influenza,inj,Quad PF,6+ Mos 12/13/2012, 12/15/2013  . Influenza-Unspecified 12/14/2013, 12/01/2014, 12/20/2019  . Moderna Sars-Covid-2 Vaccination 05/01/2019, 06/01/2019  . Pneumococcal Conjugate-13 03/29/2013  . Pneumococcal Polysaccharide-23 03/03/2007  . Td 11/17/2006  . Tdap 05/04/2017  . Zoster 03/23/2007    TDAP status: Up to date  Flu Vaccine status: Up to date  Pneumococcal vaccine status: Up to date  Covid-19 vaccine status: Completed vaccines  Qualifies for Shingles Vaccine? Yes   Zostavax completed Yes   Shingrix Completed?: No.    Education has been provided regarding the importance of this vaccine. Patient has been advised to call insurance company to determine out of pocket expense if they have not yet received this vaccine. Advised may also receive vaccine at local pharmacy or Health Dept. Verbalized acceptance and understanding.  Screening Tests Health Maintenance  Topic Date Due  . PNA vac Low Risk Adult (2 of 2 - PPSV23) 03/29/2014  . COVID-19 Vaccine (3 - Booster for Moderna series) 12/01/2019  . MAMMOGRAM  08/31/2020  . TETANUS/TDAP  05/05/2027  . INFLUENZA VACCINE  Completed  . DEXA SCAN  Completed  . Hepatitis C Screening  Completed    Health Maintenance  Health Maintenance Due  Topic Date Due  . PNA vac Low Risk Adult (2 of 2 - PPSV23) 03/29/2014  . COVID-19 Vaccine (3 - Booster for Moderna series) 12/01/2019    Colorectal cancer screening: No longer required.   Mammogram status: Completed 09/01/2019. Repeat every year  Bone Density status: Completed  08/15/2018. Results reflect: Bone density results: NORMAL. Repeat every 0  years.  Lung Cancer Screening: (Low Dose CT Chest recommended if Age 59-80 years, 30 pack-year currently smoking OR have quit w/in 15years.) does not qualify.   Lung Cancer Screening Referral: N/A   Additional Screening:  Hepatitis C Screening: does qualify; Completed 06/17/2015  Vision Screening: Recommended annual ophthalmology exams for early detection of glaucoma and other disorders of the eye. Is the patient up to date with their annual eye exam?  Yes  Who is the provider or what is the name of the office in which the patient attends annual eye exams? Dr. Delman Cheadle  If pt is not established with a provider, would they like to be referred to a provider to establish care? No .   Dental Screening: Recommended annual dental exams for proper oral hygiene  Community Resource Referral / Chronic Care Management: CRR required this visit?  No   CCM required this visit?  No      Plan:     I have personally reviewed and noted the following in the patient's chart:   . Medical and social history . Use of alcohol, tobacco or illicit drugs  . Current medications and supplements . Functional ability and status . Nutritional status . Physical activity . Advanced directives . List of other physicians . Hospitalizations, surgeries, and ER visits in previous 12 months . Vitals . Screenings to include cognitive, depression, and falls . Referrals and appointments  In addition, I have reviewed and discussed with patient certain preventive protocols, quality metrics, and best practice recommendations. A written personalized care plan for preventive services as well as general preventive health recommendations were provided to patient.     Ofilia Neas, LPN   07/05/2128   Nurse Notes: None

## 2020-04-09 ENCOUNTER — Ambulatory Visit (INDEPENDENT_AMBULATORY_CARE_PROVIDER_SITE_OTHER): Payer: PPO

## 2020-04-09 DIAGNOSIS — Z0000 Encounter for general adult medical examination without abnormal findings: Secondary | ICD-10-CM

## 2020-04-09 NOTE — Patient Instructions (Signed)
[name] , Thank you for taking time to come for your Medicare Wellness Visit. I appreciate your ongoing commitment to your health goals. Please review the following plan we discussed and let me know if I can assist you in the future.   Screening recommendations/referrals: Colonoscopy: No longer required  Mammogram: Up to date, next due 08/31/2020  Bone Density: No longer required  Recommended yearly ophthalmology/optometry visit for glaucoma screening and checkup Recommended yearly dental visit for hygiene and checkup  Vaccinations: Influenza vaccine: Up to date, next due fall 2022  Pneumococcal vaccine: Completed series  Tdap vaccine: Up to date, next due 05/05/2027 Shingles vaccine: Currently due for Shingrix, if you wish to receive we recommend that you do so at your local pharmacy as it is less expensive     Advanced directives: Please bring copies of your advanced medical directives so that we may scan them into your chart.   Conditions/risks identified: None   Next appointment: 04/10/2021 @ 10:30 am with [place] 65 Years and Older, Female Preventive care refers to lifestyle choices and visits with your health care provider that can promote health and wellness. What does preventive care include?  A yearly physical exam. This is also called an annual well check.  Dental exams once or twice a year.  Routine eye exams. Ask your health care provider how often you should have your eyes checked.  Personal lifestyle choices, including:  Daily care of your teeth and gums.  Regular physical activity.  Eating a healthy diet.  Avoiding tobacco and drug use.  Limiting alcohol use.  Practicing safe sex.  Taking low-dose aspirin every day.  Taking vitamin and mineral supplements as recommended by your health care provider. What happens during an annual well check? The services and screenings done by your health care provider during your  annual well check will depend on your age, overall health, lifestyle risk factors, and family history of disease. Counseling  Your health care provider may ask you questions about your:  Alcohol use.  Tobacco use.  Drug use.  Emotional well-being.  Home and relationship well-being.  Sexual activity.  Eating habits.  History of falls.  Memory and ability to understand (cognition).  Work and work Statistician.  Reproductive health. Screening  You may have the following tests or measurements:  Height, weight, and BMI.  Blood pressure.  Lipid and cholesterol levels. These may be checked every 5 years, or more frequently if you are over 39 years old.  Skin check.  Lung cancer screening. You may have this screening every year starting at age 22 if you have a 30-pack-year history of smoking and currently smoke or have quit within the past 15 years.  Fecal occult blood test (FOBT) of the stool. You may have this test every year starting at age 12.  Flexible sigmoidoscopy or colonoscopy. You may have a sigmoidoscopy every 5 years or a colonoscopy every 10 years starting at age 19.  Hepatitis C blood test.  Hepatitis B blood test.  Sexually transmitted disease (STD) testing.  Diabetes screening. This is done by checking your blood sugar (glucose) after you have not eaten for a while (fasting). You may have this done every 1-3 years.  Bone density scan. This is done to screen for osteoporosis. You may have this done starting at age 67.  Mammogram. This may be done every 1-2 years. Talk to your health care provider about how often you should have regular  mammograms. Talk with your health care provider about your test results, treatment options, and if necessary, the need for more tests. Vaccines  Your health care provider may recommend certain vaccines, such as:  Influenza vaccine. This is recommended every year.  Tetanus, diphtheria, and acellular pertussis (Tdap, Td)  vaccine. You may need a Td booster every 10 years.  Zoster vaccine. You may need this after age 74.  Pneumococcal 13-valent conjugate (PCV13) vaccine. One dose is recommended after age 20.  Pneumococcal polysaccharide (PPSV23) vaccine. One dose is recommended after age 74. Talk to your health care provider about which screenings and vaccines you need and how often you need them. This information is not intended to replace advice given to you by your health care provider. Make sure you discuss any questions you have with your health care provider. Document Released: 03/15/2015 Document Revised: 11/06/2015 Document Reviewed: 12/18/2014 Elsevier Interactive Patient Education  2017 [place] Prevention in the Home Falls can cause injuries. They can happen to people of all ages. There are many things you can do to make your home safe and to help prevent falls. What can I do on the outside of my home?  Regularly fix the edges of walkways and driveways and fix any cracks.  Remove anything that might make you trip as you walk through a door, such as a raised step or threshold.  Trim any bushes or trees on the path to your home.  Use bright outdoor lighting.  Clear any walking paths of anything that might make someone trip, such as rocks or tools.  Regularly check to see if handrails are loose or broken. Make sure that both sides of any steps have handrails.  Any raised decks and porches should have guardrails on the edges.  Have any leaves, snow, or ice cleared regularly.  Use sand or salt on walking paths during winter.  Clean up any spills in your garage right away. This includes oil or grease spills. What can I do in the bathroom?  Use night lights.  Install grab bars by the toilet and in the tub and shower. Do not use towel bars as grab bars.  Use non-skid mats or decals in the tub or shower.  If you need to sit down in the shower, use a plastic, non-slip  stool.  Keep the floor dry. Clean up any water that spills on the floor as soon as it happens.  Remove soap buildup in the tub or shower regularly.  Attach bath mats securely with double-sided non-slip rug tape.  Do not have throw rugs and other things on the floor that can make you trip. What can I do in the bedroom?  Use night lights.  Make sure that you have a light by your bed that is easy to reach.  Do not use any sheets or blankets that are too big for your bed. They should not hang down onto the floor.  Have a firm chair that has side arms. You can use this for support while you get dressed.  Do not have throw rugs and other things on the floor that can make you trip. What can I do in the kitchen?  Clean up any spills right away.  Avoid walking on wet floors.  Keep items that you use a lot in easy-to-reach places.  If you need to reach something above you, use a strong step stool that has a grab bar.  Keep electrical cords out of the way.  Do not use floor polish or wax that makes floors slippery. If you must use wax, use non-skid floor wax.  Do not have throw rugs and other things on the floor that can make you trip. What can I do with my stairs?  Do not leave any items on the stairs.  Make sure that there are handrails on both sides of the stairs and use them. Fix handrails that are broken or loose. Make sure that handrails are as long as the stairways.  Check any carpeting to make sure that it is firmly attached to the stairs. Fix any carpet that is loose or worn.  Avoid having throw rugs at the top or bottom of the stairs. If you do have throw rugs, attach them to the floor with carpet tape.  Make sure that you have a light switch at the top of the stairs and the bottom of the stairs. If you do not have them, ask someone to add them for you. What else can I do to help prevent falls?  Wear shoes that:  Do not have high heels.  Have rubber bottoms.  Are  comfortable and fit you well.  Are closed at the toe. Do not wear sandals.  If you use a stepladder:  Make sure that it is fully opened. Do not climb a closed stepladder.  Make sure that both sides of the stepladder are locked into place.  Ask someone to hold it for you, if possible.  Clearly mark and make sure that you can see:  Any grab bars or handrails.  First and last steps.  Where the edge of each step is.  Use tools that help you move around (mobility aids) if they are needed. These include:  Canes.  Walkers.  Scooters.  Crutches.  Turn on the lights when you go into a dark area. Replace any light bulbs as soon as they burn out.  Set up your furniture so you have a clear path. Avoid moving your furniture around.  If any of your floors are uneven, fix them.  If there are any pets around you, be aware of where they are.  Review your medicines with your doctor. Some medicines can make you feel dizzy. This can increase your chance of falling. Ask your doctor what other things that you can do to help prevent falls. This information is not intended to replace advice given to you by your health care provider. Make sure you discuss any questions you have with your health care provider. Document Released: 12/13/2008 Document Revised: 07/25/2015 Document Reviewed: 03/23/2014 Elsevier Interactive Patient Education  2017 [employer].

## 2020-06-06 ENCOUNTER — Other Ambulatory Visit: Payer: Self-pay | Admitting: Family Medicine

## 2020-08-14 ENCOUNTER — Other Ambulatory Visit: Payer: Self-pay | Admitting: Family Medicine

## 2020-08-14 ENCOUNTER — Other Ambulatory Visit: Payer: Self-pay

## 2020-08-14 NOTE — Progress Notes (Signed)
Pharmacy is requesting a medication alternative.  Below is the information

## 2020-08-14 NOTE — Telephone Encounter (Signed)
Pharmacy has requested medication alternative.  Below is the information.

## 2020-08-14 NOTE — Progress Notes (Signed)
Message has been sent to Dr requesting medication alternative.

## 2020-08-15 ENCOUNTER — Other Ambulatory Visit: Payer: Self-pay | Admitting: Family Medicine

## 2020-08-15 ENCOUNTER — Other Ambulatory Visit: Payer: Self-pay

## 2020-08-15 MED ORDER — VALSARTAN 80 MG PO TABS: 80.0000 mg | ORAL_TABLET | Freq: Every day | ORAL | 3 refills | Status: DC

## 2020-08-15 NOTE — Telephone Encounter (Signed)
Change this to Valsartan 80 mg daily. Send in a one year supply

## 2020-08-15 NOTE — Telephone Encounter (Signed)
Called patient at [phone], to inform about medication change,  no voicemail, unable to leave message.

## 2020-08-15 NOTE — Telephone Encounter (Signed)
Insurance/pharmacy has requested an alternative for Lorsartan 50mg .  Alternatives are  Telmisartan 20mg ,40, 80mg  Irbesartan 75mg ,150,300mg  Valsartan, 40mg , 80,160,320mg  Olmesartan Medoxomil-5mg , 20,40mg  Candesartan Cilextil 4mg , 8,16,32mg 

## 2020-11-07 DIAGNOSIS — M1712 Unilateral primary osteoarthritis, left knee: Secondary | ICD-10-CM | POA: Diagnosis not present

## 2020-11-07 DIAGNOSIS — M1711 Unilateral primary osteoarthritis, right knee: Secondary | ICD-10-CM | POA: Diagnosis not present

## 2020-12-05 ENCOUNTER — Encounter: Payer: Self-pay | Admitting: Family Medicine

## 2020-12-05 ENCOUNTER — Other Ambulatory Visit: Payer: Self-pay

## 2020-12-05 ENCOUNTER — Ambulatory Visit (INDEPENDENT_AMBULATORY_CARE_PROVIDER_SITE_OTHER): Payer: PPO | Admitting: Family Medicine

## 2020-12-05 VITALS — BP 120/76 | HR 72 | Temp 97.7°F | Ht 65.5 in | Wt 161.0 lb

## 2020-12-05 DIAGNOSIS — Z23 Encounter for immunization: Secondary | ICD-10-CM

## 2020-12-05 DIAGNOSIS — M810 Age-related osteoporosis without current pathological fracture: Secondary | ICD-10-CM

## 2020-12-05 DIAGNOSIS — Z0000 Encounter for general adult medical examination without abnormal findings: Secondary | ICD-10-CM | POA: Diagnosis not present

## 2020-12-05 LAB — BASIC METABOLIC PANEL
BUN: 14 mg/dL (ref 6–23)
CO2: 31 mEq/L (ref 19–32)
Calcium: 9.5 mg/dL (ref 8.4–10.5)
Chloride: 101 mEq/L (ref 96–112)
Creatinine, Ser: 0.77 mg/dL (ref 0.40–1.20)
GFR: 74.4 mL/min (ref >60.00)
Glucose, Bld: 94 mg/dL (ref 70–99)
Potassium: 4.3 mEq/L (ref 3.5–5.1)
Sodium: 138 mEq/L (ref 135–145)

## 2020-12-05 LAB — TSH: TSH: 2.85 u[IU]/mL (ref 0.35–5.50)

## 2020-12-05 LAB — CBC WITH DIFFERENTIAL/PLATELET
Basophils Absolute: 0 10*3/uL (ref 0.0–0.1)
Basophils Relative: 0.9 % (ref 0.0–3.0)
Eosinophils Absolute: 0.1 10*3/uL (ref 0.0–0.7)
Eosinophils Relative: 2.5 % (ref 0.0–5.0)
HCT: 38.2 % (ref 36.0–46.0)
Hemoglobin: 12.7 g/dL (ref 12.0–15.0)
Lymphocytes Relative: 31.3 % (ref 12.0–46.0)
Lymphs Abs: 1.7 10*3/uL (ref 0.7–4.0)
MCHC: 33.3 g/dL (ref 30.0–36.0)
MCV: 94.9 fl (ref 78.0–100.0)
Monocytes Absolute: 0.4 10*3/uL (ref 0.1–1.0)
Monocytes Relative: 7.9 % (ref 3.0–12.0)
Neutro Abs: 3.1 10*3/uL (ref 1.4–7.7)
Neutrophils Relative %: 57.4 % (ref 43.0–77.0)
Platelets: 265 10*3/uL (ref 150.0–400.0)
RBC: 4.03 Mil/uL (ref 3.87–5.11)
RDW: 13.1 % (ref 11.5–15.5)
WBC: 5.4 10*3/uL (ref 4.0–10.5)

## 2020-12-05 LAB — HEPATIC FUNCTION PANEL
ALT: 14 U/L (ref 0–35)
AST: 18 U/L (ref 0–37)
Albumin: 4.3 g/dL (ref 3.5–5.2)
Alkaline Phosphatase: 93 U/L (ref 39–117)
Bilirubin, Direct: 0.1 mg/dL (ref 0.0–0.3)
Total Bilirubin: 0.7 mg/dL (ref 0.2–1.2)
Total Protein: 7.3 g/dL (ref 6.0–8.3)

## 2020-12-05 LAB — LIPID PANEL
Cholesterol: 211 mg/dL — ABNORMAL HIGH (ref 0–200)
HDL: 67.8 mg/dL (ref >39.00)
LDL Cholesterol: 115 mg/dL — ABNORMAL HIGH (ref 0–99)
NonHDL: 142.8
Total CHOL/HDL Ratio: 3
Triglycerides: 139 mg/dL (ref 0.0–149.0)
VLDL: 27.8 mg/dL (ref 0.0–40.0)

## 2020-12-05 LAB — HEMOGLOBIN A1C: Hgb A1c MFr Bld: 5.7 % (ref 4.6–6.5)

## 2020-12-05 MED ORDER — OMEPRAZOLE 40 MG PO CPDR
DELAYED_RELEASE_CAPSULE | ORAL | 3 refills | Status: DC
Start: 2020-12-05 — End: 2022-01-19

## 2020-12-05 MED ORDER — VALSARTAN 80 MG PO TABS: 40.0000 mg | ORAL_TABLET | Freq: Every day | ORAL | 3 refills | Status: DC

## 2020-12-05 NOTE — Progress Notes (Signed)
   Subjective:    Patient ID: [name], female    DOB: 11/05/1945, 75 y.o.   MRN: [id number]  HPI Here for a well exam. She is doing well in general. She has been dealing with chronic right knee pain, and she recently saw Dr. Frederik Pear for this. He recommended a total knee replacement, but she is not ready to have this done just yet.    Review of Systems  Constitutional: Negative.   HENT: Negative.    Eyes: Negative.   Respiratory: Negative.    Cardiovascular: Negative.   Gastrointestinal: Negative.   Genitourinary:  Negative for decreased urine volume, difficulty urinating, dyspareunia, dysuria, enuresis, flank pain, frequency, hematuria, pelvic pain and urgency.  Musculoskeletal:  Positive for arthralgias.  Skin: Negative.   Neurological: Negative.  Negative for headaches.  Psychiatric/Behavioral: Negative.        Objective:   Physical Exam Constitutional:      General: She is not in acute distress.    Appearance: Normal appearance. She is well-developed.  HENT:     Head: Normocephalic and atraumatic.     Right Ear: External ear normal.     Left Ear: External ear normal.     Nose: Nose normal.     Mouth/Throat:     Pharynx: No oropharyngeal exudate.  Eyes:     General: No scleral icterus.    Conjunctiva/sclera: Conjunctivae normal.     Pupils: Pupils are equal, round, and reactive to light.  Neck:     Thyroid: No thyromegaly.     Vascular: No JVD.  Cardiovascular:     Rate and Rhythm: Normal rate and regular rhythm.     Heart sounds: Normal heart sounds. No murmur heard.   No friction rub. No gallop.  Pulmonary:     Effort: Pulmonary effort is normal. No respiratory distress.     Breath sounds: Normal breath sounds. No wheezing or rales.  Chest:     Chest wall: No tenderness.  Abdominal:     General: Bowel sounds are normal. There is no distension.     Palpations: Abdomen is soft. There is no mass.     Tenderness: There is no abdominal tenderness. There  is no guarding or rebound.  Musculoskeletal:        General: No tenderness. Normal range of motion.     Cervical back: Normal range of motion and neck supple.  Lymphadenopathy:     Cervical: No cervical adenopathy.  Skin:    General: Skin is warm and dry.     Findings: No erythema or rash.  Neurological:     Mental Status: She is alert and oriented to person, place, and time.     Cranial Nerves: No cranial nerve deficit.     Motor: No abnormal muscle tone.     Coordination: Coordination normal.     Deep Tendon Reflexes: Reflexes are normal and symmetric. Reflexes normal.  Psychiatric:        Behavior: Behavior normal.        Thought Content: Thought content normal.        Judgment: Judgment normal.          Assessment & Plan:  Well exam. We discussed diet and exercise. Get fasting labs. Set up a DEXA. Alysia Penna, MD

## 2020-12-05 NOTE — Addendum Note (Signed)
Addended by: Amanda Cockayne on: 12/05/2020 11:28 AM   Modules accepted: Orders

## 2020-12-06 ENCOUNTER — Other Ambulatory Visit: Payer: Self-pay | Admitting: Family Medicine

## 2020-12-06 DIAGNOSIS — Z1231 Encounter for screening mammogram for malignant neoplasm of breast: Secondary | ICD-10-CM

## 2021-01-10 ENCOUNTER — Other Ambulatory Visit: Payer: Self-pay

## 2021-01-10 ENCOUNTER — Other Ambulatory Visit: Payer: Self-pay | Admitting: Obstetrics & Gynecology

## 2021-01-10 ENCOUNTER — Ambulatory Visit
Admission: RE | Admit: 2021-01-10 | Discharge: 2021-01-10 | Disposition: A | Discharge status: Home / Self Care | Payer: PPO | Source: Ambulatory Visit | Attending: Family Medicine | Admitting: Family Medicine

## 2021-01-10 DIAGNOSIS — Z1231 Encounter for screening mammogram for malignant neoplasm of breast: Secondary | ICD-10-CM

## 2021-04-03 DIAGNOSIS — H5203 Hypermetropia, bilateral: Secondary | ICD-10-CM | POA: Diagnosis not present

## 2021-04-03 DIAGNOSIS — H2513 Age-related nuclear cataract, bilateral: Secondary | ICD-10-CM | POA: Diagnosis not present

## 2021-04-10 ENCOUNTER — Ambulatory Visit: Payer: PPO

## 2021-04-14 ENCOUNTER — Telehealth: Payer: Self-pay | Admitting: Family Medicine

## 2021-04-14 NOTE — Telephone Encounter (Signed)
Left message for patient to call back and schedule Medicare Annual Wellness Visit (AWV) either virtually or in office. Left  my jabber number [phone]   Last AWV 04/09/20  please schedule at anytime with [facility] Nurse Health Advisor 1 or 2   This should be a 45 minute visit.  

## 2021-05-28 ENCOUNTER — Telehealth: Payer: Self-pay | Admitting: Family Medicine

## 2021-05-28 NOTE — Telephone Encounter (Signed)
Left message for patient to call back and schedule Medicare Annual Wellness Visit (AWV) either virtually or in office. Left  my jabber number [phone]   Last AWV ; 04/09/20 please schedule at anytime with [facility] Nurse Health Advisor 1 or 2    

## 2021-06-17 ENCOUNTER — Other Ambulatory Visit: Payer: PPO

## 2021-06-23 ENCOUNTER — Telehealth: Payer: Self-pay | Admitting: Family Medicine

## 2021-06-23 NOTE — Telephone Encounter (Signed)
Left message for patient to call back and schedule Medicare Annual Wellness Visit (AWV) by phone ? ? ?Last AWV: : 04/09/2020  ? ?Please schedule at anytime with [facility] Nurse Health Advisor 1 ?  ? ?30 minute appointment for Virtual or phone ?45 minute appointment for Initial virtual/phone ? ?  ? ?Any questions, please contact me at [phone]   ?

## 2021-07-18 ENCOUNTER — Telehealth: Payer: Self-pay | Admitting: Family Medicine

## 2021-07-18 NOTE — Telephone Encounter (Signed)
Left message for patient to call back and schedule Medicare Annual Wellness Visit (AWV) either virtually or in office. Left  my jabber number [phone]   Last AWV ; 04/09/20 please schedule at anytime with [facility] Nurse Health Advisor 1 or 2    

## 2021-08-12 ENCOUNTER — Telehealth: Payer: Self-pay | Admitting: Family Medicine

## 2021-08-12 NOTE — Telephone Encounter (Signed)
Left message for patient to call back and schedule Medicare Annual Wellness Visit (AWV) either virtually or in office. Left  my jabber number [phone]   Last AWV ; 04/09/20 please schedule at anytime with [facility] Nurse Health Advisor 1 or 2    

## 2021-10-03 ENCOUNTER — Telehealth: Payer: Self-pay | Admitting: Family Medicine

## 2021-10-03 NOTE — Telephone Encounter (Signed)
Left message for patient to call back and schedule Medicare Annual Wellness Visit (AWV) either virtually or in office. Left  my Herbie Drape number [phone]   Last AWV 04/09/20  please schedule at anytime with [facility] Nurse Health Advisor 1 or 2   This should be a 45 minute visit.

## 2021-10-03 NOTE — Telephone Encounter (Signed)
Documented on spreadsheet 

## 2021-10-03 NOTE — Telephone Encounter (Signed)
Patient called back returning Robin's call. Pt said she was not interested in scheduling an AWV and stated she'd be seeing MD in October anyway.

## 2021-12-05 ENCOUNTER — Other Ambulatory Visit: Payer: Self-pay | Admitting: Family Medicine

## 2021-12-05 ENCOUNTER — Ambulatory Visit
Admission: RE | Admit: 2021-12-05 | Discharge: 2021-12-05 | Disposition: A | Discharge status: Home / Self Care | Payer: PPO | Source: Ambulatory Visit | Attending: Family Medicine | Admitting: Family Medicine

## 2021-12-05 DIAGNOSIS — M810 Age-related osteoporosis without current pathological fracture: Secondary | ICD-10-CM

## 2021-12-05 DIAGNOSIS — Z1231 Encounter for screening mammogram for malignant neoplasm of breast: Secondary | ICD-10-CM

## 2021-12-05 DIAGNOSIS — Z780 Asymptomatic menopausal state: Secondary | ICD-10-CM | POA: Diagnosis not present

## 2021-12-30 DIAGNOSIS — H5203 Hypermetropia, bilateral: Secondary | ICD-10-CM | POA: Diagnosis not present

## 2021-12-30 DIAGNOSIS — H25813 Combined forms of age-related cataract, bilateral: Secondary | ICD-10-CM | POA: Diagnosis not present

## 2021-12-30 DIAGNOSIS — H40013 Open angle with borderline findings, low risk, bilateral: Secondary | ICD-10-CM | POA: Diagnosis not present

## 2022-01-13 ENCOUNTER — Ambulatory Visit
Admission: RE | Admit: 2022-01-13 | Discharge: 2022-01-13 | Disposition: A | Discharge status: Home / Self Care | Payer: PPO | Source: Ambulatory Visit | Attending: Family Medicine | Admitting: Family Medicine

## 2022-01-13 DIAGNOSIS — Z1231 Encounter for screening mammogram for malignant neoplasm of breast: Secondary | ICD-10-CM

## 2022-01-19 ENCOUNTER — Ambulatory Visit (INDEPENDENT_AMBULATORY_CARE_PROVIDER_SITE_OTHER): Payer: PPO | Admitting: Family Medicine

## 2022-01-19 ENCOUNTER — Encounter: Payer: Self-pay | Admitting: Family Medicine

## 2022-01-19 VITALS — BP 136/82 | HR 75 | Temp 98.3°F | Ht 65.0 in | Wt 151.0 lb

## 2022-01-19 DIAGNOSIS — Z0000 Encounter for general adult medical examination without abnormal findings: Secondary | ICD-10-CM

## 2022-01-19 LAB — TSH: TSH: 3.49 u[IU]/mL (ref 0.35–5.50)

## 2022-01-19 LAB — HEMOGLOBIN A1C: Hgb A1c MFr Bld: 5.7 % (ref 4.6–6.5)

## 2022-01-19 LAB — CBC WITH DIFFERENTIAL/PLATELET
Basophils Absolute: 0 10*3/uL (ref 0.0–0.1)
Basophils Relative: 0.9 % (ref 0.0–3.0)
Eosinophils Absolute: 0.2 10*3/uL (ref 0.0–0.7)
Eosinophils Relative: 3.8 % (ref 0.0–5.0)
HCT: 38.5 % (ref 36.0–46.0)
Hemoglobin: 13 g/dL (ref 12.0–15.0)
Lymphocytes Relative: 28.5 % (ref 12.0–46.0)
Lymphs Abs: 1.6 10*3/uL (ref 0.7–4.0)
MCHC: 33.8 g/dL (ref 30.0–36.0)
MCV: 95.3 fl (ref 78.0–100.0)
Monocytes Absolute: 0.5 10*3/uL (ref 0.1–1.0)
Monocytes Relative: 7.8 % (ref 3.0–12.0)
Neutro Abs: 3.4 10*3/uL (ref 1.4–7.7)
Neutrophils Relative %: 59 % (ref 43.0–77.0)
Platelets: 277 10*3/uL (ref 150.0–400.0)
RBC: 4.04 Mil/uL (ref 3.87–5.11)
RDW: 13 % (ref 11.5–15.5)
WBC: 5.8 10*3/uL (ref 4.0–10.5)

## 2022-01-19 LAB — BASIC METABOLIC PANEL
BUN: 13 mg/dL (ref 6–23)
CO2: 30 mEq/L (ref 19–32)
Calcium: 9.1 mg/dL (ref 8.4–10.5)
Chloride: 101 mEq/L (ref 96–112)
Creatinine, Ser: 0.69 mg/dL (ref 0.40–1.20)
GFR: 83.05 mL/min (ref >60.00)
Glucose, Bld: 93 mg/dL (ref 70–99)
Potassium: 4.5 mEq/L (ref 3.5–5.1)
Sodium: 138 mEq/L (ref 135–145)

## 2022-01-19 LAB — LIPID PANEL
Cholesterol: 227 mg/dL — ABNORMAL HIGH (ref 0–200)
HDL: 73.1 mg/dL (ref >39.00)
LDL Cholesterol: 124 mg/dL — ABNORMAL HIGH (ref 0–99)
NonHDL: 153.61
Total CHOL/HDL Ratio: 3
Triglycerides: 146 mg/dL (ref 0.0–149.0)
VLDL: 29.2 mg/dL (ref 0.0–40.0)

## 2022-01-19 LAB — HEPATIC FUNCTION PANEL
ALT: 14 U/L (ref 0–35)
AST: 17 U/L (ref 0–37)
Albumin: 4.2 g/dL (ref 3.5–5.2)
Alkaline Phosphatase: 91 U/L (ref 39–117)
Bilirubin, Direct: 0.1 mg/dL (ref 0.0–0.3)
Total Bilirubin: 0.6 mg/dL (ref 0.2–1.2)
Total Protein: 7.3 g/dL (ref 6.0–8.3)

## 2022-01-19 MED ORDER — VALSARTAN 80 MG PO TABS: 40.0000 mg | ORAL_TABLET | Freq: Every day | ORAL | 3 refills | Status: DC

## 2022-01-19 MED ORDER — OMEPRAZOLE 40 MG PO CPDR: DELAYED_RELEASE_CAPSULE | ORAL | 3 refills | Status: DC

## 2022-01-19 NOTE — Progress Notes (Signed)
   Subjective:    Patient ID: [name], female    DOB: 05/24/1945, 76 y.o.   MRN: [id number]  HPI Here for a well exam. She has no complaints. Her bowels are moving regularly now that she has started taking Milk of Magnesia every day. Her BP has been stable.    Review of Systems  Constitutional: Negative.   HENT: Negative.    Eyes: Negative.   Respiratory: Negative.    Cardiovascular: Negative.   Gastrointestinal: Negative.   Genitourinary:  Negative for decreased urine volume, difficulty urinating, dyspareunia, dysuria, enuresis, flank pain, frequency, hematuria, pelvic pain and urgency.  Musculoskeletal: Negative.   Skin: Negative.   Neurological: Negative.  Negative for headaches.  Psychiatric/Behavioral: Negative.         Objective:   Physical Exam Constitutional:      General: She is not in acute distress.    Appearance: She is well-developed.  HENT:     Head: Normocephalic and atraumatic.     Right Ear: External ear normal.     Left Ear: External ear normal.     Nose: Nose normal.     Mouth/Throat:     Pharynx: No oropharyngeal exudate.  Eyes:     General: No scleral icterus.    Conjunctiva/sclera: Conjunctivae normal.     Pupils: Pupils are equal, round, and reactive to light.  Neck:     Thyroid: No thyromegaly.     Vascular: No JVD.  Cardiovascular:     Rate and Rhythm: Normal rate and regular rhythm.     Heart sounds: Normal heart sounds. No murmur heard.    No friction rub. No gallop.  Pulmonary:     Effort: Pulmonary effort is normal. No respiratory distress.     Breath sounds: Normal breath sounds. No wheezing or rales.  Chest:     Chest wall: No tenderness.  Abdominal:     General: Bowel sounds are normal. There is no distension.     Palpations: Abdomen is soft. There is no mass.     Tenderness: There is no abdominal tenderness. There is no guarding or rebound.  Musculoskeletal:        General: No tenderness. Normal range of motion.      Cervical back: Normal range of motion and neck supple.  Lymphadenopathy:     Cervical: No cervical adenopathy.  Skin:    General: Skin is warm and dry.     Findings: No erythema or rash.  Neurological:     Mental Status: She is alert and oriented to person, place, and time.     Cranial Nerves: No cranial nerve deficit.     Motor: No abnormal muscle tone.     Coordination: Coordination normal.     Deep Tendon Reflexes: Reflexes are normal and symmetric. Reflexes normal.  Psychiatric:        Behavior: Behavior normal.        Thought Content: Thought content normal.        Judgment: Judgment normal.           Assessment & Plan:  Well exam. We discussed diet and exercise. Get fasting labs. Alysia Penna, MD

## 2022-01-21 ENCOUNTER — Telehealth: Payer: Self-pay | Admitting: Family Medicine

## 2022-01-21 NOTE — Telephone Encounter (Signed)
Pt called, returning CMA's call. CMA was unavailable. Pt asked that CMA call back at their earliest convenience. 

## 2022-01-21 NOTE — Telephone Encounter (Signed)
Spoke with patient.

## 2022-03-25 DIAGNOSIS — Z6825 Body mass index (BMI) 25.0-25.9, adult: Secondary | ICD-10-CM | POA: Diagnosis not present

## 2022-03-25 DIAGNOSIS — Z01419 Encounter for gynecological examination (general) (routine) without abnormal findings: Secondary | ICD-10-CM | POA: Diagnosis not present

## 2022-05-18 ENCOUNTER — Other Ambulatory Visit: Payer: Self-pay

## 2022-05-18 ENCOUNTER — Telehealth: Payer: Self-pay | Admitting: Family Medicine

## 2022-05-18 MED ORDER — VALSARTAN 80 MG PO TABS: 40.0000 mg | ORAL_TABLET | Freq: Every day | ORAL | 3 refills | Status: DC

## 2022-05-18 MED ORDER — OMEPRAZOLE 40 MG PO CPDR: DELAYED_RELEASE_CAPSULE | ORAL | 3 refills | Status: DC

## 2022-05-18 NOTE — Telephone Encounter (Signed)
Pt Rx sent to [facility]. Left detailed message for pt on her mobile phone

## 2022-05-18 NOTE — Telephone Encounter (Signed)
Pt is calling and [facility] will not transfer her medication to [facility]. Please send rx to  [facility], [place] Phone: [phone]  Fax: [phone]       omeprazole (PRILOSEC) 40 MG capsule , valsartan (DIOVAN) 80 MG tablet #90 each refills

## 2022-09-24 ENCOUNTER — Encounter: Payer: Self-pay | Admitting: Family Medicine

## 2022-09-24 ENCOUNTER — Ambulatory Visit (INDEPENDENT_AMBULATORY_CARE_PROVIDER_SITE_OTHER): Payer: HMO | Admitting: Family Medicine

## 2022-09-24 VITALS — BP 130/82 | HR 76 | Temp 98.3°F | Wt 160.0 lb

## 2022-09-24 DIAGNOSIS — R0602 Shortness of breath: Secondary | ICD-10-CM

## 2022-09-24 DIAGNOSIS — M940 Chondrocostal junction syndrome [Tietze]: Secondary | ICD-10-CM | POA: Diagnosis not present

## 2022-09-24 DIAGNOSIS — R42 Dizziness and giddiness: Secondary | ICD-10-CM

## 2022-09-24 LAB — BASIC METABOLIC PANEL
BUN: 14 mg/dL (ref 6–23)
CO2: 30 mEq/L (ref 19–32)
Calcium: 9.4 mg/dL (ref 8.4–10.5)
Chloride: 101 mEq/L (ref 96–112)
Creatinine, Ser: 0.68 mg/dL (ref 0.40–1.20)
GFR: 82.94 mL/min (ref >60.00)
Glucose, Bld: 95 mg/dL (ref 70–99)
Potassium: 4.3 mEq/L (ref 3.5–5.1)
Sodium: 138 mEq/L (ref 135–145)

## 2022-09-24 LAB — CBC WITH DIFFERENTIAL/PLATELET
Basophils Absolute: 0.1 10*3/uL (ref 0.0–0.1)
Basophils Relative: 1 % (ref 0.0–3.0)
Eosinophils Absolute: 0.2 10*3/uL (ref 0.0–0.7)
Eosinophils Relative: 2.7 % (ref 0.0–5.0)
HCT: 40 % (ref 36.0–46.0)
Hemoglobin: 13.4 g/dL (ref 12.0–15.0)
Lymphocytes Relative: 28.6 % (ref 12.0–46.0)
Lymphs Abs: 1.7 10*3/uL (ref 0.7–4.0)
MCHC: 33.5 g/dL (ref 30.0–36.0)
MCV: 95.6 fl (ref 78.0–100.0)
Monocytes Absolute: 0.5 10*3/uL (ref 0.1–1.0)
Monocytes Relative: 8.1 % (ref 3.0–12.0)
Neutro Abs: 3.5 10*3/uL (ref 1.4–7.7)
Neutrophils Relative %: 59.6 % (ref 43.0–77.0)
Platelets: 273 10*3/uL (ref 150.0–400.0)
RBC: 4.18 Mil/uL (ref 3.87–5.11)
RDW: 13.1 % (ref 11.5–15.5)
WBC: 5.9 10*3/uL (ref 4.0–10.5)

## 2022-09-24 LAB — TSH: TSH: 3.75 u[IU]/mL (ref 0.35–5.50)

## 2022-09-24 NOTE — Progress Notes (Addendum)
   Subjective:    Patient ID: [name], female    DOB: Mar 08, 1945, 77 y.o.   MRN: [id number]  HPI Here for several issues. First about 2 months ago she began to experience SOB on exertion. She describes walking 4 blocks to the grocery store, and sometimes she would have to stop and rest due to SOB. Usually this resolves in a few minutes. She also describes some occasional chest pain which she says is sharp, only lasts a few seconds, and is worsened by taking deep breaths. No coughing or wheezing. A third issue is dizziness which started a few months ago. She describes feeling as the room is spinning when she moves her head quickly or when he stoops to pick up something. This usually lasts a few minutes and then resolves. No headache or vision changes. As we speak today she feels fine. Of note she will be seeing Dr. Serena Colonel next month for her chronic hoarseness.    Review of Systems  Constitutional: Negative.   HENT: Negative.    Eyes: Negative.   Respiratory:  Positive for shortness of breath. Negative for cough and wheezing.   Cardiovascular:  Positive for chest pain. Negative for palpitations and leg swelling.  Neurological:  Positive for dizziness. Negative for tremors, seizures, syncope, facial asymmetry, speech difficulty, weakness, light-headedness, numbness and headaches.       Objective:   Physical Exam Constitutional:      Appearance: Normal appearance. She is not ill-appearing.  HENT:     Right Ear: Tympanic membrane, ear canal and external ear normal.     Left Ear: Tympanic membrane, ear canal and external ear normal.  Eyes:     Pupils: Pupils are equal, round, and reactive to light.  Cardiovascular:     Rate and Rhythm: Normal rate and regular rhythm.     Pulses: Normal pulses.     Heart sounds: Normal heart sounds.     Comments: EKG today is normal  Pulmonary:     Effort: Pulmonary effort is normal.     Breath sounds: Normal breath sounds.  Musculoskeletal:      Cervical back: Neck supple.     Right lower leg: No edema.     Left lower leg: No edema.  Lymphadenopathy:     Cervical: No cervical adenopathy.  Neurological:     General: No focal deficit present.     Mental Status: She is alert and oriented to person, place, and time.     Motor: No weakness.     Coordination: Coordination normal.     Gait: Gait normal.           Assessment & Plan:  She seems to have 3 distinct issues. The dizziness is consistent with vertigo, likely BPV. She can try Meclizine for this as needed. The sharp chest pains are consistent with costochondritis. I explained this to her and we agreed to simply monitor this for now. The SOB on exertion is more worrisome. We will get labs and a CXR today. We will also set up an ECHO and a myoview perfusion study. We spent a total of ( 35  ) minutes reviewing records and discussing these issues.  Gershon Crane, MD

## 2022-10-01 ENCOUNTER — Telehealth: Payer: Self-pay | Admitting: Family Medicine

## 2022-10-01 NOTE — Telephone Encounter (Signed)
Pt says stress test and echocardiogram ordered need a PA

## 2022-10-07 ENCOUNTER — Ambulatory Visit (HOSPITAL_COMMUNITY): Payer: HMO | Attending: Family Medicine

## 2022-10-07 DIAGNOSIS — R0602 Shortness of breath: Secondary | ICD-10-CM | POA: Insufficient documentation

## 2022-10-07 LAB — MYOCARDIAL PERFUSION IMAGING: Rest Nuclear Isotope Dose: 10.6 mCi

## 2022-10-07 MED ORDER — TECHNETIUM TC 99M TETROFOSMIN IV KIT
10.6000 | PACK | Freq: Once | INTRAVENOUS | Status: AC | PRN
  Administered 2022-10-07: 10.6 via INTRAVENOUS

## 2022-10-21 ENCOUNTER — Ambulatory Visit (HOSPITAL_COMMUNITY): Payer: HMO | Attending: Family Medicine

## 2022-10-21 ENCOUNTER — Ambulatory Visit (HOSPITAL_COMMUNITY): Payer: HMO

## 2022-10-21 DIAGNOSIS — R0602 Shortness of breath: Secondary | ICD-10-CM

## 2022-10-21 LAB — MYOCARDIAL PERFUSION IMAGING
Exercise duration (min): 1 min
Rest HR: 86 {beats}/min

## 2022-10-21 LAB — ECHOCARDIOGRAM COMPLETE
Area-P 1/2: 3.51 cm2
S' Lateral: 2.8 cm

## 2022-10-21 MED ORDER — REGADENOSON 0.4 MG/5ML IV SOLN
0.4000 mg | Freq: Once | INTRAVENOUS | Status: AC
Start: 2022-10-21 — End: 2022-10-21
  Administered 2022-10-21: 0.4 mg via INTRAVENOUS

## 2022-10-21 MED ORDER — TECHNETIUM TC 99M TETROFOSMIN IV KIT
31.2000 | PACK | Freq: Once | INTRAVENOUS | Status: AC | PRN
  Administered 2022-10-21: 31.2 via INTRAVENOUS

## 2022-10-29 DIAGNOSIS — J3800 Paralysis of vocal cords and larynx, unspecified: Secondary | ICD-10-CM | POA: Diagnosis not present

## 2022-10-29 DIAGNOSIS — K219 Gastro-esophageal reflux disease without esophagitis: Secondary | ICD-10-CM | POA: Diagnosis not present

## 2022-11-13 ENCOUNTER — Other Ambulatory Visit: Payer: HMO

## 2022-11-13 ENCOUNTER — Ambulatory Visit: Payer: HMO

## 2022-11-13 DIAGNOSIS — I700 Atherosclerosis of aorta: Secondary | ICD-10-CM | POA: Diagnosis not present

## 2022-11-13 DIAGNOSIS — R0602 Shortness of breath: Secondary | ICD-10-CM

## 2022-11-13 DIAGNOSIS — R9389 Abnormal findings on diagnostic imaging of other specified body structures: Secondary | ICD-10-CM | POA: Diagnosis not present

## 2022-11-24 NOTE — Progress Notes (Signed)
11/24/2022  Patient ID: [name], female   DOB: Nov 14, 1945, 77 y.o.   MRN: [id number]  Pharmacy Quality Measure Review  This patient is appearing on a report for being at risk of failing the adherence measure for hypertension (ACEi/ARB) medications this calendar year.   Medication: Valsartan Last fill date: 10/20/22 for 90 day supply  Insurance report was not up to date. No action needed at this time.   Sherrill Raring, PharmD Clinical Pharmacist [phone]

## 2023-01-05 ENCOUNTER — Telehealth: Payer: Self-pay | Admitting: Family Medicine

## 2023-01-05 DIAGNOSIS — R0602 Shortness of breath: Secondary | ICD-10-CM

## 2023-01-05 NOTE — Telephone Encounter (Signed)
Pt is still waiting on pulmonology referral for sob and chest pain

## 2023-01-05 NOTE — Telephone Encounter (Signed)
I did the referral 

## 2023-01-06 NOTE — Telephone Encounter (Signed)
Left a message for pt regarding referral, advised to call the office back

## 2023-01-14 ENCOUNTER — Other Ambulatory Visit: Payer: Self-pay | Admitting: Family Medicine

## 2023-01-14 DIAGNOSIS — Z1231 Encounter for screening mammogram for malignant neoplasm of breast: Secondary | ICD-10-CM

## 2023-01-18 DIAGNOSIS — H5203 Hypermetropia, bilateral: Secondary | ICD-10-CM | POA: Diagnosis not present

## 2023-01-18 DIAGNOSIS — H2513 Age-related nuclear cataract, bilateral: Secondary | ICD-10-CM | POA: Diagnosis not present

## 2023-01-18 DIAGNOSIS — H43813 Vitreous degeneration, bilateral: Secondary | ICD-10-CM | POA: Diagnosis not present

## 2023-02-11 ENCOUNTER — Ambulatory Visit (INDEPENDENT_AMBULATORY_CARE_PROVIDER_SITE_OTHER): Payer: HMO | Admitting: Family Medicine

## 2023-02-11 ENCOUNTER — Encounter: Payer: Self-pay | Admitting: Family Medicine

## 2023-02-11 VITALS — BP 128/76 | HR 70 | Temp 98.0°F | Ht 65.0 in | Wt 161.8 lb

## 2023-02-11 DIAGNOSIS — Z0000 Encounter for general adult medical examination without abnormal findings: Secondary | ICD-10-CM

## 2023-02-11 LAB — CBC WITH DIFFERENTIAL/PLATELET
Basophils Absolute: 0 10*3/uL (ref 0.0–0.1)
Basophils Relative: 0.6 % (ref 0.0–3.0)
Eosinophils Absolute: 0.1 10*3/uL (ref 0.0–0.7)
Eosinophils Relative: 2.6 % (ref 0.0–5.0)
HCT: 38.4 % (ref 36.0–46.0)
Hemoglobin: 13 g/dL (ref 12.0–15.0)
Lymphocytes Relative: 25.5 % (ref 12.0–46.0)
Lymphs Abs: 1.4 10*3/uL (ref 0.7–4.0)
MCHC: 33.9 g/dL (ref 30.0–36.0)
MCV: 96.3 fL (ref 78.0–100.0)
Monocytes Absolute: 0.4 10*3/uL (ref 0.1–1.0)
Monocytes Relative: 6.6 % (ref 3.0–12.0)
Neutro Abs: 3.7 10*3/uL (ref 1.4–7.7)
Neutrophils Relative %: 64.7 % (ref 43.0–77.0)
Platelets: 284 10*3/uL (ref 150.0–400.0)
RBC: 3.99 Mil/uL (ref 3.87–5.11)
RDW: 12.8 % (ref 11.5–15.5)
WBC: 5.7 10*3/uL (ref 4.0–10.5)

## 2023-02-11 LAB — BASIC METABOLIC PANEL
BUN: 15 mg/dL (ref 6–23)
CO2: 30 meq/L (ref 19–32)
Calcium: 8.7 mg/dL (ref 8.4–10.5)
Chloride: 102 meq/L (ref 96–112)
Creatinine, Ser: 0.65 mg/dL (ref 0.40–1.20)
GFR: 83.62 mL/min (ref >60.00)
Glucose, Bld: 95 mg/dL (ref 70–99)
Potassium: 4.2 meq/L (ref 3.5–5.1)
Sodium: 140 meq/L (ref 135–145)

## 2023-02-11 LAB — TSH: TSH: 3.08 u[IU]/mL (ref 0.35–5.50)

## 2023-02-11 LAB — HEPATIC FUNCTION PANEL
ALT: 13 U/L (ref 0–35)
AST: 17 U/L (ref 0–37)
Albumin: 4.3 g/dL (ref 3.5–5.2)
Alkaline Phosphatase: 96 U/L (ref 39–117)
Bilirubin, Direct: 0.1 mg/dL (ref 0.0–0.3)
Total Bilirubin: 0.7 mg/dL (ref 0.2–1.2)
Total Protein: 7.1 g/dL (ref 6.0–8.3)

## 2023-02-11 LAB — LIPID PANEL
Cholesterol: 217 mg/dL — ABNORMAL HIGH (ref 0–200)
HDL: 65.7 mg/dL (ref >39.00)
LDL Cholesterol: 122 mg/dL — ABNORMAL HIGH (ref 0–99)
NonHDL: 151.5
Total CHOL/HDL Ratio: 3
Triglycerides: 148 mg/dL (ref 0.0–149.0)
VLDL: 29.6 mg/dL (ref 0.0–40.0)

## 2023-02-11 LAB — HEMOGLOBIN A1C: Hgb A1c MFr Bld: 5.6 % (ref 4.6–6.5)

## 2023-02-11 NOTE — Progress Notes (Signed)
Subjective:    Patient ID: [name], female    DOB: [birth year], 77 y.o.   MRN: [id number]  HPI Here for a well exam. She is doing well in general although she still has constant mild SOB. We ruled out cardiac causes for this last year including a myocardial perfusion study and an ECHO. Her CXR was clear. We have referred her to Pulmonology, and she is scheduled to see Dr. Mechele Collin on 03-24-23.    Review of Systems  Constitutional: Negative.   HENT: Negative.    Eyes: Negative.   Respiratory:  Positive for shortness of breath.   Cardiovascular: Negative.   Gastrointestinal: Negative.   Genitourinary:  Negative for decreased urine volume, difficulty urinating, dyspareunia, dysuria, enuresis, flank pain, frequency, hematuria, pelvic pain and urgency.  Musculoskeletal: Negative.   Skin: Negative.   Neurological: Negative.  Negative for headaches.  Psychiatric/Behavioral: Negative.         Objective:   Physical Exam Constitutional:      General: She is not in acute distress.    Appearance: Normal appearance. She is well-developed. She is not diaphoretic.  HENT:     Head: Normocephalic and atraumatic.     Right Ear: External ear normal.     Left Ear: External ear normal.     Nose: Nose normal.     Mouth/Throat:     Pharynx: Oropharynx is clear.     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       Right eye: No discharge.        Left eye: No discharge.     Conjunctiva/sclera: Conjunctivae normal.     Pupils: Pupils are equal, round, and reactive to light.  Neck:     Thyroid: No thyromegaly.     Vascular: No JVD.  Cardiovascular:     Rate and Rhythm: Normal rate and regular rhythm.     Pulses: Normal pulses.     Heart sounds: Normal heart sounds. No murmur heard.    No friction rub. No gallop.  Pulmonary:     Effort: Pulmonary effort is normal. No respiratory distress.     Breath sounds: Normal  breath sounds. No stridor. No wheezing or rales.  Chest:     Chest wall: No tenderness.  Abdominal:     General: Bowel sounds are normal. There is no distension or abdominal bruit.     Palpations: Abdomen is soft. Abdomen is not rigid. There is no mass.     Tenderness: There is no abdominal tenderness. There is no guarding or rebound.     Hernia: No hernia is present.  Genitourinary:    Vagina: Normal. No vaginal discharge, erythema, tenderness or bleeding.     Cervix: No cervical motion tenderness, discharge or friability.     Adnexa:        Right: No mass, tenderness or fullness.         Left: No mass, tenderness or fullness.       Rectum: Normal.  Musculoskeletal:        General: No tenderness. Normal range of motion.     Cervical back: Normal range of motion and neck supple.  Lymphadenopathy:     Cervical: No cervical adenopathy.  Skin:    General: Skin is warm and dry.     Coloration: Skin is not pale.     Findings: No erythema or rash.  Neurological:  General: No focal deficit present.     Mental Status: She is alert.     Cranial Nerves: No cranial nerve deficit.     Motor: No abnormal muscle tone.     Coordination: Coordination normal.     Deep Tendon Reflexes: Reflexes are normal and symmetric.  Psychiatric:        Mood and Affect: Mood normal.        Behavior: Behavior normal.        Thought Content: Thought content normal.        Judgment: Judgment normal.           Assessment & Plan:  Well exam. We discussed diet and exercise. Get fasting labs. S he will see Dr. Everardo All for SOB as above.  Gershon Crane, MD

## 2023-02-18 ENCOUNTER — Telehealth: Payer: Self-pay

## 2023-02-18 NOTE — Telephone Encounter (Signed)
CRM sent to wrong office.  Forward to [facility] clinical pool.   Copied from CRM [id number]. Topic: General - Other >> Feb 17, 2023  4:29 PM [name] wrote: Reason for CRM: Patient called back stating she was returning [name]'s call.  Patient asked if [name] can call her back as soon as she received this message  [phone] [name]

## 2023-02-18 NOTE — Telephone Encounter (Signed)
Left pt a message to return my call in the office

## 2023-02-18 NOTE — Telephone Encounter (Signed)
Pt stated she is returning your call and want you to call her back.

## 2023-02-22 NOTE — Telephone Encounter (Signed)
Reviewed lab results with pt, verbalized understanding 

## 2023-02-25 ENCOUNTER — Ambulatory Visit: Payer: HMO

## 2023-03-11 ENCOUNTER — Ambulatory Visit
Admission: RE | Admit: 2023-03-11 | Discharge: 2023-03-11 | Disposition: A | Discharge status: Home / Self Care | Payer: HMO | Source: Ambulatory Visit | Attending: Family Medicine | Admitting: Family Medicine

## 2023-03-11 DIAGNOSIS — Z1231 Encounter for screening mammogram for malignant neoplasm of breast: Secondary | ICD-10-CM

## 2023-03-24 ENCOUNTER — Institutional Professional Consult (permissible substitution) (HOSPITAL_BASED_OUTPATIENT_CLINIC_OR_DEPARTMENT_OTHER): Payer: HMO | Admitting: Pulmonary Disease

## 2023-05-10 ENCOUNTER — Encounter (HOSPITAL_BASED_OUTPATIENT_CLINIC_OR_DEPARTMENT_OTHER): Payer: Self-pay | Admitting: Pulmonary Disease

## 2023-05-10 ENCOUNTER — Ambulatory Visit (HOSPITAL_BASED_OUTPATIENT_CLINIC_OR_DEPARTMENT_OTHER): Payer: HMO | Admitting: Pulmonary Disease

## 2023-05-10 VITALS — BP 122/78 | HR 86 | Ht 65.0 in | Wt 162.8 lb

## 2023-05-10 DIAGNOSIS — R0609 Other forms of dyspnea: Secondary | ICD-10-CM | POA: Diagnosis not present

## 2023-05-10 MED ORDER — ALBUTEROL SULFATE HFA 108 (90 BASE) MCG/ACT IN AERS: 2.0000 | INHALATION_SPRAY | Freq: Four times a day (QID) | RESPIRATORY_TRACT | 3 refills | Status: AC | PRN

## 2023-05-10 NOTE — Patient Instructions (Addendum)
 x schedule CT coronaries  x cardiology consultation  x trial of albuterol MDI 2 puffs as needed prior to exertion  x schedule PFTs

## 2023-05-10 NOTE — Progress Notes (Signed)
 Subjective:    Patient ID: [name], female    DOB: 1945/04/30, 78 y.o.   MRN: [id number]  HPI  78 year old woman, never smoker referred for evaluation of dyspnea on exertion  Myocardial perfusion study 10/2022 was low risk, normal LVEF Echo showed normal LVEF, no valvular abnormalities Chest x-ray showed mild elevation of right hemidiaphragm, no infiltrates   The patient, a retired Engineer, site, presents with shortness of breath and chest pressure. She reports difficulty breathing and 'huffing and puffing' when walking a short distance to the grocery store, particularly when attempting to talk while walking. This has been ongoing since last spring and was more pronounced in hot weather. She also experiences chest tightness and pressure at rest, such as when bending over to paint her toenails. The symptoms are not worsening and are not associated with any specific activity or time of day.  The patient has a family history of heart disease, but a recent echocardiogram and stress test with contrast were normal. She has never smoked and does not have a history of asthma. She also reports occasional coughing and congestion, but is unsure if this is related to her throat or chest.  She does not desaturate on walking  Past Medical History:  Diagnosis Date   GERD (gastroesophageal reflux disease)    Gynecological examination    sees Dr. Tonny Bollman in [place] for GYN exams   Hyperlipidemia    Hypertension    Past Surgical History:  Procedure Laterality Date   ABDOMINAL HYSTERECTOMY     COLONOSCOPY  09/29/2018   per Dr. Myrtie Neither, adenomatous polyps, no repeats needed due to age    FACELIFT     KNEE SURGERY Left 01/13/2019   TONSILLECTOMY      Allergies  Allergen Reactions   Latex Other (See Comments)    Social History   Socioeconomic History   Marital status: Single    Spouse name: Not on file   Number of children: Not on file   Years of education: Not on file    Highest education level: Not on file  Occupational History   Not on file  Tobacco Use   Smoking status: Never   Smokeless tobacco: Never  Vaping Use   Vaping status: Never Used  Substance and Sexual Activity   Alcohol use: Yes    Alcohol/week: 1.0 standard drink of alcohol    Types: 1 Standard drinks or equivalent per week    Comment: mixed drink or a glass of wine   Drug use: No   Sexual activity: Not on file  Other Topics Concern   Not on file  Social History Narrative   Not on file   Social Drivers of Health   Financial Resource Strain: Low Risk  (04/09/2020)   Overall Financial Resource Strain (CARDIA)    Difficulty of Paying Living Expenses: Not hard at all  Food Insecurity: Low Risk  (10/29/2022)   Received from [facility]   Hunger Vital Sign    Worried About Running Out of Food in the Last Year: Never true    Ran Out of Food in the Last Year: Never true  Transportation Needs: No Transportation Needs (10/29/2022)   Received from [facility]    In the past 12 months, has lack of reliable transportation kept you from medical appointments, meetings, work or from getting things needed for daily living? : No  Physical Activity: Inactive (04/09/2020)   Exercise Vital Sign    Days of  Exercise per Week: 0 days    Minutes of Exercise per Session: 0 min  Stress: No Stress Concern Present (04/09/2020)   [employer] of Occupational Health - Occupational Stress Questionnaire    Feeling of Stress : Not at all  Social Connections: Socially Isolated (04/09/2020)   Social Connection and Isolation Panel [NHANES]    Frequency of Communication with Friends and Family: More than three times a week    Frequency of Social Gatherings with Friends and Family: Once a week    Attends Religious Services: Never    Database administrator or Organizations: No    Attends Banker Meetings: Never    Marital Status: Never married  Intimate Partner Violence: Not At  Risk (04/09/2020)   Humiliation, Afraid, Rape, and Kick questionnaire    Fear of Current or Ex-Partner: No    Emotionally Abused: No    Physically Abused: No    Sexually Abused: No    Family History  Problem Relation Age of Onset   Arthritis Other    Heart disease Other    Breast cancer Maternal Aunt    Breast cancer Maternal Grandmother    Colon cancer Neg Hx    Stomach cancer Neg Hx    Pancreatic cancer Neg Hx    Esophageal cancer Neg Hx      Review of Systems Constitutional: negative for anorexia, fevers and sweats  Eyes: negative for irritation, redness and visual disturbance  Ears, nose, mouth, throat, and face: negative for earaches, epistaxis, nasal congestion and sore throat  Respiratory: negative for cough, dsputum and wheezing  Cardiovascular: negative for  lower extremity edema, orthopnea, palpitations and syncope  Gastrointestinal: negative for abdominal pain, constipation, diarrhea, melena, nausea and vomiting  Genitourinary:negative for dysuria, frequency and hematuria  Hematologic/lymphatic: negative for bleeding, easy bruising and lymphadenopathy  Musculoskeletal:negative for arthralgias, muscle weakness and stiff joints  Neurological: negative for coordination problems, gait problems, headaches and weakness  Endocrine: negative for diabetic symptoms including polydipsia, polyuria and weight loss     Objective:   Physical Exam  Gen. Pleasant,  in no distress, normal affect ENT - no pallor,icterus, no post nasal drip, class 2-3 airway Neck: No JVD, no thyromegaly, no carotid bruits Lungs: no use of accessory muscles, no dullness to percussion, decreased without rales or rhonchi  Cardiovascular: Rhythm regular, heart sounds  normal, no murmurs or gallops, no peripheral edema Abdomen: soft and non-tender, no hepatosplenomegaly, BS normal. Musculoskeletal: No deformities, no cyanosis or clubbing Neuro:  alert, non focal, no tremors       Assessment & Plan:    Exertional Dyspnea Reports dyspnea and chest tightness during exertion, such as walking and bending over. Symptoms stable. Cardiac evaluation including echocardiogram and stress test normal. Chest X-ray normal. Differential diagnosis includes asthma, though atypical at this age without history. PFT and lung scan considered for further evaluation. Discussed financial constraints and preference to space out tests. Explained PFT is low yield but necessary to rule out asthma. Lung scan to assess heart and lung condition, particularly coronary calcification. Discussed option to see cardiologist first. Inhaler trial for symptomatic relief before exertion suggested. - Order PFT - Order lung scan - Refer to cardiologist for further evaluation - Administer trial of inhaler for symptomatic relief before exertion - Conduct walking test to monitor oxygen levels  General Health Maintenance Otherwise healthy with no significant past medical history. History of knee surgery, lives independently. Mindful of financial constraints, prefers to space out medical tests. -  Coordinate scheduling of tests to accommodate financial situation - Keep primary care physician (Dr. Clent Ridges) updated on progress  Follow-up - Schedule lung scan at [facility] - Schedule PFT - Refer to cardiologist for follow-up after lung scan results - Follow-up to discuss test results and next steps.

## 2023-05-28 ENCOUNTER — Ambulatory Visit (HOSPITAL_COMMUNITY)
Admission: RE | Admit: 2023-05-28 | Discharge: 2023-05-28 | Disposition: A | Discharge status: Home / Self Care | Payer: Self-pay | Source: Ambulatory Visit | Attending: Pulmonary Disease | Admitting: Pulmonary Disease

## 2023-05-28 DIAGNOSIS — R0609 Other forms of dyspnea: Secondary | ICD-10-CM | POA: Insufficient documentation

## 2023-05-31 NOTE — Progress Notes (Signed)
 Pt.notified

## 2023-06-02 ENCOUNTER — Encounter (HOSPITAL_BASED_OUTPATIENT_CLINIC_OR_DEPARTMENT_OTHER): Payer: Self-pay

## 2023-07-12 ENCOUNTER — Other Ambulatory Visit: Payer: Self-pay | Admitting: Family Medicine

## 2023-08-16 ENCOUNTER — Ambulatory Visit (HOSPITAL_BASED_OUTPATIENT_CLINIC_OR_DEPARTMENT_OTHER): Admitting: Pulmonary Disease

## 2023-10-01 NOTE — Progress Notes (Signed)
 Ashton Staff MD Reason for referral-chest pain and dyspnea on exertion  HPI: 78 year old female for evaluation of chest pain and dyspnea on exertion at request of Harden Staff MD. Echocardiogram August 2024 showed normal LV function, mild left ventricular hypertrophy, grade 1 diastolic dysfunction.  Nuclear study August 2024 showed ejection fraction 72% and normal perfusion.  Calcium score March 2025 0.  Patient complains of chest tightness and dyspnea and cardiology asked to evaluate.  Patient states that for the past 2 years she has had occasional chest tightness.  They can occur with activities but also at rest.  It is substernal without radiation or associated symptoms.  Described as a tightness lasting approximately 20 minutes and resolving spontaneously.  She has some dyspnea on exertion but no orthopnea, PND or pedal edema.  No syncope.  Current Outpatient Medications  Medication Sig Dispense Refill   albuterol  (VENTOLIN  HFA) 108 (90 Base) MCG/ACT inhaler Inhale 2 puffs into the lungs every 6 (six) hours as needed for wheezing or shortness of breath (take prior to exertion). 8 g 3   aspirin 81 MG tablet Take 81 mg by mouth daily.     calcium-vitamin D  (OSCAL WITH D) 500-200 MG-UNIT per tablet Take 1 tablet by mouth 2 (two) times daily. Taking Ciltrate     estradiol (ESTRACE) 2 MG tablet Take 1 mg by mouth daily.      magnesium hydroxide (MILK OF MAGNESIA) 400 MG/5ML suspension Take 5 mLs by mouth daily as needed.     omeprazole  (PRILOSEC) 40 MG capsule TAKE 1 CAPSULE(40 MG) BY MOUTH DAILY 90 capsule 3   valsartan  (DIOVAN ) 80 MG tablet Take 1/2 (one-half) tablet by mouth once daily 45 tablet 0   No current facility-administered medications for this visit.    Allergies  Allergen Reactions   Latex Other (See Comments)     Past Medical History:  Diagnosis Date   GERD (gastroesophageal reflux disease)    Gynecological examination    sees Dr. Vicenta in [place] for  GYN exams   Hyperlipidemia    Hypertension     Past Surgical History:  Procedure Laterality Date   ABDOMINAL HYSTERECTOMY     COLONOSCOPY  09/29/2018   per Dr. Legrand, adenomatous polyps, no repeats needed due to age    FACELIFT     KNEE SURGERY Left 01/13/2019   TONSILLECTOMY      Social History   Socioeconomic History   Marital status: Single    Spouse name: Not on file   Number of children: Not on file   Years of education: Not on file   Highest education level: Not on file  Occupational History   Not on file  Tobacco Use   Smoking status: Never   Smokeless tobacco: Never  Vaping Use   Vaping status: Never Used  Substance and Sexual Activity   Alcohol use: Yes    Alcohol/week: 1.0 standard drink of alcohol    Types: 1 Standard drinks or equivalent per week    Comment: mixed drink or a glass of wine   Drug use: No   Sexual activity: Not on file  Other Topics Concern   Not on file  Social History Narrative   Not on file   Social Drivers of Health   Financial Resource Strain: Low Risk  (04/09/2020)   Overall Financial Resource Strain (CARDIA)    Difficulty of Paying Living Expenses: Not hard at all  Food Insecurity: Low Risk  (10/29/2022)   Received  from [facility]   Hunger Vital Sign    Within the past 12 months, you worried that your food would run out before you got money to buy more: Never true    Within the past 12 months, the food you bought just didn't last and you didn't have money to get more. : Never true  Transportation Needs: No Transportation Needs (10/29/2022)   Received from [facility]    In the past 12 months, has lack of reliable transportation kept you from medical appointments, meetings, work or from getting things needed for daily living? : No  Physical Activity: Inactive (04/09/2020)   Exercise Vital Sign    Days of Exercise per Week: 0 days    Minutes of Exercise per Session: 0 min  Stress: No Stress Concern Present  (04/09/2020)   [employer] of Occupational Health - Occupational Stress Questionnaire    Feeling of Stress : Not at all  Social Connections: Socially Isolated (04/09/2020)   Social Connection and Isolation Panel    Frequency of Communication with Friends and Family: More than three times a week    Frequency of Social Gatherings with Friends and Family: Once a week    Attends Religious Services: Never    Database administrator or Organizations: No    Attends Banker Meetings: Never    Marital Status: Never married  Intimate Partner Violence: Not At Risk (04/09/2020)   Humiliation, Afraid, Rape, and Kick questionnaire    Fear of Current or Ex-Partner: No    Emotionally Abused: No    Physically Abused: No    Sexually Abused: No    Family History  Problem Relation Age of Onset   Arthritis Other    Heart disease Other    Breast cancer Maternal Aunt    Breast cancer Maternal Grandmother    Colon cancer Neg Hx    Stomach cancer Neg Hx    Pancreatic cancer Neg Hx    Esophageal cancer Neg Hx     ROS: no fevers or chills, productive cough, hemoptysis, dysphasia, odynophagia, melena, hematochezia, dysuria, hematuria, rash, seizure activity, orthopnea, PND, pedal edema, claudication. Remaining systems are negative.  Physical Exam:   There were no vitals taken for this visit.  General:  Well developed/well nourished in NAD Skin warm/dry Patient not depressed No peripheral clubbing Back-normal HEENT-normal/normal eyelids Neck supple/normal carotid upstroke bilaterally; no bruits; no JVD; no thyromegaly chest - CTA/ normal expansion CV - RRR/normal S1 and S2; no murmurs, rubs or gallops;  PMI nondisplaced Abdomen -NT/ND, no HSM, no mass, + bowel sounds, no bruit 2+ femoral pulses, no bruits Ext-no edema, chords, 2+ DP Neuro-grossly nonfocal  EKG Interpretation Date/Time:  Wednesday October 13 2023 10:58:39 EDT Ventricular Rate:  74 PR Interval:  146 QRS  Duration:  72 QT Interval:  360 QTC Calculation: 399 R Axis:   -6  Text Interpretation: Normal sinus rhythm Low voltage QRS Cannot rule out Anterior infarct , age undetermined No previous ECGs available Confirmed by Pietro Rogue ([id number]) on 10/13/2023 11:11:20 AM    A/P  1 chest pain-symptoms with both typical and atypical features.  Will arrange coronary CTA to rule out obstructive coronary disease.  2 dyspnea on exertion-recent echocardiogram showed normal LV function.  Await CTA results.  3 hypertension-patient's blood pressure is controlled.  Continue present medical regimen.  4 hyperlipidemia-per primary care.  Rogue Pietro, MD

## 2023-10-06 ENCOUNTER — Other Ambulatory Visit: Payer: Self-pay | Admitting: Family Medicine

## 2023-10-13 ENCOUNTER — Encounter: Payer: Self-pay | Admitting: Cardiology

## 2023-10-13 ENCOUNTER — Ambulatory Visit: Attending: Cardiology | Admitting: Cardiology

## 2023-10-13 VITALS — BP 120/78 | HR 74 | Ht 65.25 in | Wt 163.1 lb

## 2023-10-13 DIAGNOSIS — R0609 Other forms of dyspnea: Secondary | ICD-10-CM | POA: Diagnosis not present

## 2023-10-13 DIAGNOSIS — I10 Essential (primary) hypertension: Secondary | ICD-10-CM

## 2023-10-13 DIAGNOSIS — R072 Precordial pain: Secondary | ICD-10-CM | POA: Diagnosis not present

## 2023-10-13 MED ORDER — METOPROLOL TARTRATE 100 MG PO TABS: ORAL_TABLET | ORAL | 0 refills | Status: AC

## 2023-10-13 NOTE — Patient Instructions (Signed)
   Testing/Procedures:   Your cardiac CT will be scheduled at    [facility] [address] [place], [place] [address] [phone]  If scheduled at [facility], please arrive 30 minutes early for check-in and test prep.  Please follow these instructions carefully (unless otherwise directed):  An IV will be required for this test and Nitroglycerin will be given.    On the Night Before the Test: Be sure to Drink plenty of water. Do not consume any caffeinated/decaffeinated beverages or chocolate 12 hours prior to your test. Do not take any antihistamines 12 hours prior to your test.   On the Day of the Test: Drink plenty of water until 1 hour prior to the test. Do not eat any food 1 hour prior to test. You may take your regular medications prior to the test.  Take metoprolol  (Lopressor ) 100 mg two hours prior to test. FEMALES- please wear underwire-free bra if available, avoid dresses & tight clothing      After the Test: Drink plenty of water. After receiving IV contrast, you may experience a mild flushed feeling. This is normal. On occasion, you may experience a mild rash up to 24 hours after the test. This is not dangerous. If this occurs, you can take Benadryl 25 mg, Zyrtec, Claritin, or Allegra and increase your fluid intake. (Patients taking Tikosyn should avoid Benadryl, and may take Zyrtec, Claritin, or Allegra) If you experience trouble breathing, this can be serious. If it is severe call 911 IMMEDIATELY. If it is mild, please call our office.  We will call to schedule your test 2-4 weeks out understanding that some insurance companies will need an authorization prior to the service being performed.   For more information and frequently asked questions, please visit our website : [email or web address]  For non-scheduling related questions, please contact the cardiac imaging nurse navigator should you have any  questions/concerns: Cardiac Imaging Nurse Navigators Direct Office Dial: [phone]   For scheduling needs, including cancellations and rescheduling, please call [place], [phone].   Follow-Up: At [facility] you and your health needs are our priority.  As part of our continuing mission to provide you with exceptional heart care, our providers are all part of one team.  This team includes your primary Cardiologist (physician) and Advanced Practice Providers or APPs (Physician Assistants and Nurse Practitioners) who all work together to provide you with the care you need, when you need it.  Your next appointment:    As needed pending test results

## 2023-11-04 ENCOUNTER — Other Ambulatory Visit: Payer: Self-pay | Admitting: Family Medicine

## 2024-01-25 DIAGNOSIS — H40021 Open angle with borderline findings, high risk, right eye: Secondary | ICD-10-CM | POA: Diagnosis not present

## 2024-01-25 DIAGNOSIS — H5203 Hypermetropia, bilateral: Secondary | ICD-10-CM | POA: Diagnosis not present

## 2024-01-25 DIAGNOSIS — H2513 Age-related nuclear cataract, bilateral: Secondary | ICD-10-CM | POA: Diagnosis not present

## 2024-01-25 DIAGNOSIS — H43813 Vitreous degeneration, bilateral: Secondary | ICD-10-CM | POA: Diagnosis not present

## 2024-02-14 ENCOUNTER — Ambulatory Visit (INDEPENDENT_AMBULATORY_CARE_PROVIDER_SITE_OTHER): Admitting: Family Medicine

## 2024-02-14 ENCOUNTER — Encounter: Payer: Self-pay | Admitting: Family Medicine

## 2024-02-14 VITALS — BP 128/70 | HR 78 | Temp 99.1°F | Ht 65.0 in | Wt 165.0 lb

## 2024-02-14 DIAGNOSIS — Z0000 Encounter for general adult medical examination without abnormal findings: Secondary | ICD-10-CM

## 2024-02-14 DIAGNOSIS — R490 Dysphonia: Secondary | ICD-10-CM | POA: Diagnosis not present

## 2024-02-14 LAB — CBC WITH DIFFERENTIAL/PLATELET
Basophils Absolute: 0.1 K/uL (ref 0.0–0.1)
Basophils Relative: 1 % (ref 0.0–3.0)
Eosinophils Absolute: 0.2 K/uL (ref 0.0–0.7)
Eosinophils Relative: 2.8 % (ref 0.0–5.0)
HCT: 38.8 % (ref 36.0–46.0)
Hemoglobin: 13.2 g/dL (ref 12.0–15.0)
Lymphocytes Relative: 22.3 % (ref 12.0–46.0)
Lymphs Abs: 1.3 K/uL (ref 0.7–4.0)
MCHC: 34.1 g/dL (ref 30.0–36.0)
MCV: 94.5 fl (ref 78.0–100.0)
Monocytes Absolute: 0.4 K/uL (ref 0.1–1.0)
Monocytes Relative: 7.4 % (ref 3.0–12.0)
Neutro Abs: 3.8 K/uL (ref 1.4–7.7)
Neutrophils Relative %: 66.5 % (ref 43.0–77.0)
Platelets: 282 K/uL (ref 150.0–400.0)
RBC: 4.11 Mil/uL (ref 3.87–5.11)
RDW: 13.3 % (ref 11.5–15.5)
WBC: 5.7 K/uL (ref 4.0–10.5)

## 2024-02-14 LAB — LIPID PANEL
Cholesterol: 233 mg/dL — ABNORMAL HIGH (ref 0–200)
HDL: 75.9 mg/dL (ref >39.00)
LDL Cholesterol: 126 mg/dL — ABNORMAL HIGH (ref 0–99)
NonHDL: 157.55
Total CHOL/HDL Ratio: 3
Triglycerides: 158 mg/dL — ABNORMAL HIGH (ref 0.0–149.0)
VLDL: 31.6 mg/dL (ref 0.0–40.0)

## 2024-02-14 LAB — BASIC METABOLIC PANEL WITH GFR
BUN: 16 mg/dL (ref 6–23)
CO2: 31 meq/L (ref 19–32)
Calcium: 9 mg/dL (ref 8.4–10.5)
Chloride: 101 meq/L (ref 96–112)
Creatinine, Ser: 0.65 mg/dL (ref 0.40–1.20)
GFR: 83.03 mL/min (ref >60.00)
Glucose, Bld: 95 mg/dL (ref 70–99)
Potassium: 4.5 meq/L (ref 3.5–5.1)
Sodium: 138 meq/L (ref 135–145)

## 2024-02-14 LAB — HEPATIC FUNCTION PANEL
ALT: 15 U/L (ref 0–35)
AST: 16 U/L (ref 0–37)
Albumin: 4.3 g/dL (ref 3.5–5.2)
Alkaline Phosphatase: 112 U/L (ref 39–117)
Bilirubin, Direct: 0.1 mg/dL (ref 0.0–0.3)
Total Bilirubin: 0.6 mg/dL (ref 0.2–1.2)
Total Protein: 7.4 g/dL (ref 6.0–8.3)

## 2024-02-14 LAB — TSH: TSH: 4.54 u[IU]/mL (ref 0.35–5.50)

## 2024-02-14 LAB — HEMOGLOBIN A1C: Hgb A1c MFr Bld: 5.6 % (ref 4.6–6.5)

## 2024-02-14 NOTE — Progress Notes (Signed)
 Subjective:    Patient ID: [name], female    DOB: May 17, 1945, 78 y.o.   MRN: [id number]  HPI Here for a well exam. She has a few issues to discuss. First she continues to have mild SOB on exertion, but this has actually improved in the past year. No chest pain. Her CXR last year was clear. She saw Dr. Jude in March for this, and he referred her to Dr. Pietro for a cardiology consultation. Her ECHO and stress test 2 years ago were normal. He ordered a cardiac CT calcium test and her score was zero. She decided to simply live with this. She also continues to have hoarseness in the voice that comes and goes. She saw Dr. 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 She had a CT of the neck that was clear. Since then her hoarseness has continued, but she is worried that something else is wrong in her neck. There is no neck pain. No trouble swallowing. Finally she has frequent running of her nose with clear mucus.     Review of Systems  Constitutional: Negative.   HENT:  Positive for rhinorrhea and voice change.   Eyes: Negative.   Respiratory:  Positive for shortness of breath.   Cardiovascular: Negative.   Gastrointestinal: Negative.   Genitourinary:  Negative for decreased urine volume, difficulty urinating, dyspareunia, dysuria, enuresis, flank pain, frequency, hematuria, pelvic pain and urgency.  Musculoskeletal: Negative.   Skin: Negative.   Neurological: Negative.  Negative for headaches.  Psychiatric/Behavioral: Negative.         Objective:   Physical Exam Constitutional:      General: She is not in acute distress.    Appearance: Normal appearance. She is well-developed.  HENT:     Head: Normocephalic and atraumatic.     Right Ear: External ear normal.     Left Ear: External ear normal.     Nose: Nose normal.     Mouth/Throat:     Pharynx: No oropharyngeal exudate.  Eyes:     General: No scleral icterus.     Conjunctiva/sclera: Conjunctivae normal.     Pupils: Pupils are equal, round, and reactive to light.  Neck:     Thyroid : No thyromegaly.     Vascular: No JVD.     Comments: Her voice is slightly hoarse  Cardiovascular:     Rate and Rhythm: Normal rate and regular rhythm.     Pulses: Normal pulses.     Heart sounds: Normal heart sounds. No murmur heard.    No friction rub. No gallop.  Pulmonary:     Effort: Pulmonary effort is normal. No respiratory distress.     Breath sounds: Normal breath sounds. No wheezing or rales.  Chest:     Chest wall: No tenderness.  Abdominal:     General: Bowel sounds are normal. There is no distension.     Palpations: Abdomen is soft. There is no mass.     Tenderness: There is no abdominal tenderness. There is no guarding or rebound.  Musculoskeletal:        General: No tenderness. Normal range of motion.     Cervical back: Normal range of motion and neck supple.  Lymphadenopathy:     Cervical: No cervical adenopathy.  Skin:    General: Skin is warm and dry.     Findings: No erythema or rash.  Neurological:     General: No focal  deficit present.     Mental Status: She is alert and oriented to person, place, and time.     Cranial Nerves: No cranial nerve deficit.     Motor: No abnormal muscle tone.     Coordination: Coordination normal.     Deep Tendon Reflexes: Reflexes are normal and symmetric. Reflexes normal.  Psychiatric:        Mood and Affect: Mood normal.        Behavior: Behavior normal.        Thought Content: Thought content normal.        Judgment: Judgment normal.           Assessment & Plan:  Well exam. We discussed diet and exercise. Get fasting labs. For the hoarseness, we will set up another neck CT. Her SOB appears to have improved so we will monitor this for now. For the runny nose, I suggested she try Claritin daily.  Garnette Olmsted, MD

## 2024-02-15 ENCOUNTER — Ambulatory Visit: Payer: Self-pay | Admitting: Family Medicine

## 2024-02-16 ENCOUNTER — Telehealth: Payer: Self-pay

## 2024-02-16 NOTE — Telephone Encounter (Signed)
Copy mailed to home address per patient request 

## 2024-02-16 NOTE — Telephone Encounter (Signed)
 Copied from CRM #[id number]. Topic: Clinical - Lab/Test Results >> Feb 16, 2024 10:01 AM [name] wrote: Reason for CRM: Patient would like a copy of her lab results mailed to her. Address on file confirmed.

## 2024-02-22 ENCOUNTER — Ambulatory Visit
Admission: RE | Admit: 2024-02-22 | Discharge: 2024-02-22 | Disposition: A | Discharge status: Home / Self Care | Source: Ambulatory Visit | Attending: Family Medicine | Admitting: Family Medicine

## 2024-02-22 DIAGNOSIS — R490 Dysphonia: Secondary | ICD-10-CM

## 2024-02-22 MED ORDER — IOPAMIDOL (ISOVUE-370) INJECTION 76%
75.0000 mL | Freq: Once | INTRAVENOUS | Status: AC | PRN
  Administered 2024-02-22: 75 mL via INTRAVENOUS

## 2024-03-09 ENCOUNTER — Telehealth: Payer: Self-pay | Admitting: Family Medicine

## 2024-03-09 DIAGNOSIS — M810 Age-related osteoporosis without current pathological fracture: Secondary | ICD-10-CM

## 2024-03-09 NOTE — Telephone Encounter (Signed)
 Copied from CRM #[id number]. Topic: Referral - Request for Referral >> Mar 09, 2024  1:46 PM Roselie BROCKS wrote: Did the patient discuss referral with their provider in the last year? Yes (If No - schedule appointment) (If Yes - send message)  Appointment offered? No  Type of order/referral and detailed reason for visit: Bone Density test   Preference of office, provider, location: [facility]  Phone [phone]  If referral order, have you been seen by this specialty before? No (If Yes, this issue or another issue? When? Where?  Can we respond through MyChart? No

## 2024-03-15 NOTE — Telephone Encounter (Signed)
 Patient has called to get an update on referral for bone density test. Please call her back once dr fry send it over.

## 2024-03-16 NOTE — Telephone Encounter (Signed)
 I'm not sure what happened but I put in another order for this. She can call Radiology at [facility] if she does not hear from them in the next week

## 2024-03-27 ENCOUNTER — Inpatient Hospital Stay: Admission: RE | Admit: 2024-03-27 | Source: Ambulatory Visit

## 2024-03-30 ENCOUNTER — Telehealth: Payer: Self-pay

## 2024-03-30 NOTE — Telephone Encounter (Signed)
 Copied from CRM #[id number]. Topic: Clinical - Lab/Test Results >> Mar 29, 2024  1:33 PM Alexandria E wrote: Reason for CRM: Patient had a CT scan done on 12/23 and she is wanting to go over those results with Inocente. Please call patient to discuss. >> Mar 30, 2024 11:46 AM Drema MATSU wrote: Pt is returning a call from Nancy.

## 2024-03-30 NOTE — Telephone Encounter (Signed)
" °  Returned pt call left a message advised pt to call the office back regarding her CT results Copied from CRM #[id number]. Topic: Clinical - Lab/Test Results >> Mar 29, 2024  1:33 PM Alexandria E wrote: Reason for CRM: Patient had a CT scan done on 12/23 and she is wanting to go over those results with Inocente. Please call patient to discuss. "

## 2024-03-31 NOTE — Telephone Encounter (Signed)
 Attempted to return pt call no success speaking with pt will try again later

## 2024-03-31 NOTE — Telephone Encounter (Unsigned)
 Copied from CRM #[id number]. Topic: Clinical - Lab/Test Results >> Mar 29, 2024  1:33 PM Alexandria E wrote: Reason for CRM: Patient had a CT scan done on 12/23 and she is wanting to go over those results with Inocente. Please call patient to discuss. >> Mar 31, 2024 12:25 PM Delon T wrote: Returning call about results >> Mar 30, 2024 11:46 AM Drema MATSU wrote: Pt is returning a call from Nancy.

## 2024-03-31 NOTE — Telephone Encounter (Signed)
Pt is returning nancy call 

## 2024-04-05 NOTE — Telephone Encounter (Signed)
 Returned pt call to review CT results not successful speaking with pt, advised pt to call the office back

## 2024-04-11 ENCOUNTER — Other Ambulatory Visit
